# Patient Record
Sex: Female | Born: 1960 | Race: White | Hispanic: No | Smoking: Never smoker
Health system: Southern US, Community
[De-identification: ages and names within clinical notes are randomized; demographics above are authoritative.]

## PROBLEM LIST (undated history)

## (undated) DIAGNOSIS — M1990 Unspecified osteoarthritis, unspecified site: Secondary | ICD-10-CM

## (undated) DIAGNOSIS — I341 Nonrheumatic mitral (valve) prolapse: Secondary | ICD-10-CM

## (undated) DIAGNOSIS — IMO0001 Reserved for inherently not codable concepts without codable children: Secondary | ICD-10-CM

## (undated) DIAGNOSIS — C4491 Basal cell carcinoma of skin, unspecified: Secondary | ICD-10-CM

## (undated) DIAGNOSIS — I10 Essential (primary) hypertension: Secondary | ICD-10-CM

## (undated) DIAGNOSIS — S12100A Unspecified displaced fracture of second cervical vertebra, initial encounter for closed fracture: Secondary | ICD-10-CM

## (undated) DIAGNOSIS — M8580 Other specified disorders of bone density and structure, unspecified site: Secondary | ICD-10-CM

## (undated) HISTORY — DX: Unspecified displaced fracture of second cervical vertebra, initial encounter for closed fracture: S12.100A

## (undated) HISTORY — DX: Reserved for inherently not codable concepts without codable children: IMO0001

## (undated) HISTORY — DX: Essential (primary) hypertension: I10

## (undated) HISTORY — PX: COLONOSCOPY: SHX174

## (undated) HISTORY — PX: POLYPECTOMY: SHX149

## (undated) HISTORY — PX: OTHER SURGICAL HISTORY: SHX169

## (undated) HISTORY — DX: Unspecified osteoarthritis, unspecified site: M19.90

## (undated) HISTORY — DX: Other specified disorders of bone density and structure, unspecified site: M85.80

## (undated) HISTORY — DX: Basal cell carcinoma of skin, unspecified: C44.91

## (undated) HISTORY — DX: Nonrheumatic mitral (valve) prolapse: I34.1

---

## 1984-01-20 DIAGNOSIS — S12100A Unspecified displaced fracture of second cervical vertebra, initial encounter for closed fracture: Secondary | ICD-10-CM

## 1984-01-20 HISTORY — DX: Unspecified displaced fracture of second cervical vertebra, initial encounter for closed fracture: S12.100A

## 1998-12-05 ENCOUNTER — Other Ambulatory Visit: Admission: RE | Admit: 1998-12-05 | Discharge: 1998-12-05 | Payer: Self-pay | Admitting: Obstetrics and Gynecology

## 1999-12-22 ENCOUNTER — Other Ambulatory Visit: Admission: RE | Admit: 1999-12-22 | Discharge: 1999-12-22 | Payer: Self-pay | Admitting: Obstetrics and Gynecology

## 2001-01-17 ENCOUNTER — Other Ambulatory Visit: Admission: RE | Admit: 2001-01-17 | Discharge: 2001-01-17 | Payer: Self-pay | Admitting: Obstetrics and Gynecology

## 2003-01-23 ENCOUNTER — Other Ambulatory Visit: Admission: RE | Admit: 2003-01-23 | Discharge: 2003-01-23 | Payer: Self-pay | Admitting: Family Medicine

## 2003-01-29 ENCOUNTER — Encounter: Admission: RE | Admit: 2003-01-29 | Discharge: 2003-04-29 | Payer: Self-pay | Admitting: Neurology

## 2004-01-24 ENCOUNTER — Ambulatory Visit: Payer: Self-pay | Admitting: Family Medicine

## 2004-01-24 ENCOUNTER — Other Ambulatory Visit: Admission: RE | Admit: 2004-01-24 | Discharge: 2004-01-24 | Payer: Self-pay | Admitting: Family Medicine

## 2004-05-12 ENCOUNTER — Ambulatory Visit: Payer: Self-pay | Admitting: Family Medicine

## 2004-05-15 ENCOUNTER — Encounter: Admission: RE | Admit: 2004-05-15 | Discharge: 2004-05-15 | Payer: Self-pay | Admitting: Family Medicine

## 2005-01-30 ENCOUNTER — Ambulatory Visit: Payer: Self-pay | Admitting: Family Medicine

## 2005-01-30 ENCOUNTER — Other Ambulatory Visit: Admission: RE | Admit: 2005-01-30 | Discharge: 2005-01-30 | Payer: Self-pay | Admitting: Family Medicine

## 2005-01-30 ENCOUNTER — Encounter: Payer: Self-pay | Admitting: Family Medicine

## 2005-02-10 ENCOUNTER — Ambulatory Visit: Payer: Self-pay | Admitting: Family Medicine

## 2005-05-22 ENCOUNTER — Encounter: Admission: RE | Admit: 2005-05-22 | Discharge: 2005-05-22 | Payer: Self-pay | Admitting: Family Medicine

## 2006-02-02 ENCOUNTER — Other Ambulatory Visit: Admission: RE | Admit: 2006-02-02 | Discharge: 2006-02-02 | Payer: Self-pay | Admitting: Family Medicine

## 2006-02-02 ENCOUNTER — Ambulatory Visit: Payer: Self-pay | Admitting: Family Medicine

## 2006-02-02 ENCOUNTER — Encounter: Payer: Self-pay | Admitting: Family Medicine

## 2006-05-24 ENCOUNTER — Encounter: Admission: RE | Admit: 2006-05-24 | Discharge: 2006-05-24 | Payer: Self-pay | Admitting: Family Medicine

## 2006-12-14 ENCOUNTER — Telehealth (INDEPENDENT_AMBULATORY_CARE_PROVIDER_SITE_OTHER): Payer: Self-pay | Admitting: *Deleted

## 2007-01-20 HISTORY — PX: DILATION AND CURETTAGE OF UTERUS: SHX78

## 2007-02-14 ENCOUNTER — Ambulatory Visit: Payer: Self-pay | Admitting: Internal Medicine

## 2007-02-14 ENCOUNTER — Other Ambulatory Visit: Admission: RE | Admit: 2007-02-14 | Discharge: 2007-02-14 | Payer: Self-pay | Admitting: Family Medicine

## 2007-02-14 ENCOUNTER — Encounter: Payer: Self-pay | Admitting: Internal Medicine

## 2007-02-14 DIAGNOSIS — Z8679 Personal history of other diseases of the circulatory system: Secondary | ICD-10-CM | POA: Insufficient documentation

## 2007-02-14 DIAGNOSIS — I10 Essential (primary) hypertension: Secondary | ICD-10-CM | POA: Insufficient documentation

## 2007-02-14 DIAGNOSIS — Z85828 Personal history of other malignant neoplasm of skin: Secondary | ICD-10-CM | POA: Insufficient documentation

## 2007-02-14 LAB — CONVERTED CEMR LAB
Bilirubin Urine: NEGATIVE
Blood in Urine, dipstick: NEGATIVE
Glucose, Urine, Semiquant: NEGATIVE
Ketones, urine, test strip: NEGATIVE
Nitrite: NEGATIVE
Protein, U semiquant: NEGATIVE
Specific Gravity, Urine: <1.005
Urobilinogen, UA: NEGATIVE
WBC Urine, dipstick: NEGATIVE
pH: 5

## 2007-02-15 ENCOUNTER — Encounter: Payer: Self-pay | Admitting: Internal Medicine

## 2007-02-15 LAB — CONVERTED CEMR LAB
ALT: 20 units/L (ref 0–35)
AST: 22 units/L (ref 0–37)
BUN: 10 mg/dL (ref 6–23)
Basophils Absolute: 0 10*3/uL (ref 0.0–0.1)
Basophils Relative: 0.4 % (ref 0.0–1.0)
CO2: 26 meq/L (ref 19–32)
Calcium: 9 mg/dL (ref 8.4–10.5)
Chloride: 104 meq/L (ref 96–112)
Cholesterol: 173 mg/dL (ref 0–200)
Creatinine, Ser: 0.7 mg/dL (ref 0.4–1.2)
Eosinophils Absolute: 0.1 10*3/uL (ref 0.0–0.6)
Eosinophils Relative: 1.3 % (ref 0.0–5.0)
GFR calc Af Amer: 116 mL/min
GFR calc non Af Amer: 96 mL/min
Glucose, Bld: 96 mg/dL (ref 70–99)
HCT: 41 % (ref 36.0–46.0)
HDL: 69.8 mg/dL (ref >39.0)
Hemoglobin: 14 g/dL (ref 12.0–15.0)
LDL Cholesterol: 91 mg/dL (ref 0–99)
Lymphocytes Relative: 33 % (ref 12.0–46.0)
MCHC: 34.1 g/dL (ref 30.0–36.0)
MCV: 92.3 fL (ref 78.0–100.0)
Monocytes Absolute: 0.3 10*3/uL (ref 0.2–0.7)
Monocytes Relative: 5.6 % (ref 3.0–11.0)
Neutro Abs: 3.2 10*3/uL (ref 1.4–7.7)
Neutrophils Relative %: 59.7 % (ref 43.0–77.0)
Pap Smear: NORMAL
Platelets: 245 10*3/uL (ref 150–400)
Potassium: 3.9 meq/L (ref 3.5–5.1)
RBC: 4.44 M/uL (ref 3.87–5.11)
RDW: 11.5 % (ref 11.5–14.6)
Sodium: 136 meq/L (ref 135–145)
TSH: 0.93 microintl units/mL (ref 0.35–5.50)
Total CHOL/HDL Ratio: 2.5
Triglycerides: 59 mg/dL (ref 0–149)
VLDL: 12 mg/dL (ref 0–40)
WBC: 5.4 10*3/uL (ref 4.5–10.5)

## 2007-02-18 ENCOUNTER — Encounter: Payer: Self-pay | Admitting: Internal Medicine

## 2007-02-21 ENCOUNTER — Encounter (INDEPENDENT_AMBULATORY_CARE_PROVIDER_SITE_OTHER): Payer: Self-pay | Admitting: *Deleted

## 2007-04-08 ENCOUNTER — Encounter: Payer: Self-pay | Admitting: Internal Medicine

## 2007-04-08 ENCOUNTER — Encounter (HOSPITAL_COMMUNITY): Payer: Self-pay | Admitting: Obstetrics and Gynecology

## 2007-04-08 ENCOUNTER — Ambulatory Visit (HOSPITAL_COMMUNITY): Admission: RE | Admit: 2007-04-08 | Discharge: 2007-04-08 | Payer: Self-pay | Admitting: Obstetrics and Gynecology

## 2007-05-26 ENCOUNTER — Encounter: Admission: RE | Admit: 2007-05-26 | Discharge: 2007-05-26 | Payer: Self-pay | Admitting: Internal Medicine

## 2007-05-30 ENCOUNTER — Encounter (INDEPENDENT_AMBULATORY_CARE_PROVIDER_SITE_OTHER): Payer: Self-pay | Admitting: *Deleted

## 2008-04-02 ENCOUNTER — Ambulatory Visit: Payer: Self-pay | Admitting: Cardiology

## 2008-04-02 ENCOUNTER — Encounter: Payer: Self-pay | Admitting: Emergency Medicine

## 2008-04-02 ENCOUNTER — Ambulatory Visit: Payer: Self-pay | Admitting: Diagnostic Radiology

## 2008-04-03 ENCOUNTER — Encounter: Payer: Self-pay | Admitting: Internal Medicine

## 2008-04-03 ENCOUNTER — Ambulatory Visit: Payer: Self-pay | Admitting: Cardiovascular Disease

## 2008-04-03 ENCOUNTER — Observation Stay (HOSPITAL_COMMUNITY): Admission: EM | Admit: 2008-04-03 | Discharge: 2008-04-04 | Payer: Self-pay | Admitting: Internal Medicine

## 2008-04-03 ENCOUNTER — Ambulatory Visit: Payer: Self-pay | Admitting: Endocrinology

## 2008-04-04 ENCOUNTER — Telehealth (INDEPENDENT_AMBULATORY_CARE_PROVIDER_SITE_OTHER): Payer: Self-pay

## 2008-04-05 ENCOUNTER — Ambulatory Visit: Payer: Self-pay

## 2008-04-05 ENCOUNTER — Encounter: Payer: Self-pay | Admitting: Internal Medicine

## 2008-04-05 DIAGNOSIS — IMO0001 Reserved for inherently not codable concepts without codable children: Secondary | ICD-10-CM

## 2008-04-05 HISTORY — DX: Reserved for inherently not codable concepts without codable children: IMO0001

## 2008-04-11 ENCOUNTER — Ambulatory Visit: Payer: Self-pay | Admitting: Internal Medicine

## 2008-04-11 DIAGNOSIS — R1013 Epigastric pain: Secondary | ICD-10-CM

## 2008-04-11 DIAGNOSIS — K7689 Other specified diseases of liver: Secondary | ICD-10-CM | POA: Insufficient documentation

## 2008-04-11 DIAGNOSIS — R079 Chest pain, unspecified: Secondary | ICD-10-CM | POA: Insufficient documentation

## 2008-04-11 DIAGNOSIS — K3189 Other diseases of stomach and duodenum: Secondary | ICD-10-CM | POA: Insufficient documentation

## 2008-04-18 ENCOUNTER — Telehealth (INDEPENDENT_AMBULATORY_CARE_PROVIDER_SITE_OTHER): Payer: Self-pay | Admitting: *Deleted

## 2008-04-20 ENCOUNTER — Ambulatory Visit (HOSPITAL_BASED_OUTPATIENT_CLINIC_OR_DEPARTMENT_OTHER): Admission: RE | Admit: 2008-04-20 | Discharge: 2008-04-20 | Payer: Self-pay | Admitting: Internal Medicine

## 2008-04-20 ENCOUNTER — Ambulatory Visit: Payer: Self-pay | Admitting: Diagnostic Radiology

## 2008-04-26 ENCOUNTER — Telehealth: Payer: Self-pay | Admitting: Internal Medicine

## 2008-05-29 ENCOUNTER — Encounter: Admission: RE | Admit: 2008-05-29 | Discharge: 2008-05-29 | Payer: Self-pay | Admitting: Obstetrics and Gynecology

## 2009-03-27 ENCOUNTER — Telehealth (INDEPENDENT_AMBULATORY_CARE_PROVIDER_SITE_OTHER): Payer: Self-pay | Admitting: *Deleted

## 2009-05-28 LAB — CONVERTED CEMR LAB: Pap Smear: NORMAL

## 2009-05-30 ENCOUNTER — Encounter: Admission: RE | Admit: 2009-05-30 | Discharge: 2009-05-30 | Payer: Self-pay | Admitting: Obstetrics and Gynecology

## 2009-05-30 LAB — HM MAMMOGRAPHY: HM Mammogram: NORMAL

## 2009-06-18 ENCOUNTER — Ambulatory Visit: Payer: Self-pay | Admitting: Internal Medicine

## 2009-06-24 LAB — CONVERTED CEMR LAB
ALT: 24 units/L (ref 0–35)
AST: 32 units/L (ref 0–37)
Albumin: 4.2 g/dL (ref 3.5–5.2)
Alkaline Phosphatase: 45 units/L (ref 39–117)
BUN: 13 mg/dL (ref 6–23)
Basophils Absolute: 0 10*3/uL (ref 0.0–0.1)
Basophils Relative: 0.8 % (ref 0.0–3.0)
Bilirubin, Direct: 0.1 mg/dL (ref 0.0–0.3)
CO2: 29 meq/L (ref 19–32)
Calcium: 9.3 mg/dL (ref 8.4–10.5)
Chloride: 100 meq/L (ref 96–112)
Cholesterol: 215 mg/dL — ABNORMAL HIGH (ref 0–200)
Creatinine, Ser: 0.7 mg/dL (ref 0.4–1.2)
Direct LDL: 124.7 mg/dL
Eosinophils Absolute: 0.1 10*3/uL (ref 0.0–0.7)
Eosinophils Relative: 1.2 % (ref 0.0–5.0)
GFR calc non Af Amer: 99.83 mL/min (ref >60)
Glucose, Bld: 75 mg/dL (ref 70–99)
HCT: 40.1 % (ref 36.0–46.0)
HDL: 71.9 mg/dL (ref >39.00)
Hemoglobin: 14.1 g/dL (ref 12.0–15.0)
Lymphocytes Relative: 38.9 % (ref 12.0–46.0)
Lymphs Abs: 1.9 10*3/uL (ref 0.7–4.0)
MCHC: 35.2 g/dL (ref 30.0–36.0)
MCV: 93.8 fL (ref 78.0–100.0)
Monocytes Absolute: 0.3 10*3/uL (ref 0.1–1.0)
Monocytes Relative: 5.7 % (ref 3.0–12.0)
Neutro Abs: 2.6 10*3/uL (ref 1.4–7.7)
Neutrophils Relative %: 53.4 % (ref 43.0–77.0)
Platelets: 216 10*3/uL (ref 150.0–400.0)
Potassium: 4.4 meq/L (ref 3.5–5.1)
RBC: 4.28 M/uL (ref 3.87–5.11)
RDW: 12.6 % (ref 11.5–14.6)
Sodium: 137 meq/L (ref 135–145)
TSH: 0.76 microintl units/mL (ref 0.35–5.50)
Total Bilirubin: 0.7 mg/dL (ref 0.3–1.2)
Total CHOL/HDL Ratio: 3
Total Protein: 6.9 g/dL (ref 6.0–8.3)
Triglycerides: 74 mg/dL (ref 0.0–149.0)
VLDL: 14.8 mg/dL (ref 0.0–40.0)
WBC: 4.9 10*3/uL (ref 4.5–10.5)

## 2009-07-23 ENCOUNTER — Telehealth (INDEPENDENT_AMBULATORY_CARE_PROVIDER_SITE_OTHER): Payer: Self-pay | Admitting: *Deleted

## 2010-02-16 LAB — CONVERTED CEMR LAB
ALT: 32 units/L (ref 0–40)
AST: 25 units/L (ref 0–37)
Albumin: 4.1 g/dL (ref 3.5–5.2)
Alkaline Phosphatase: 47 units/L (ref 39–117)
BUN: 11 mg/dL (ref 6–23)
Basophils Absolute: 0 10*3/uL (ref 0.0–0.1)
Basophils Relative: 0.6 % (ref 0.0–1.0)
CO2: 28 meq/L (ref 19–32)
Calcium: 9.7 mg/dL (ref 8.4–10.5)
Chloride: 102 meq/L (ref 96–112)
Cholesterol: 193 mg/dL (ref 0–200)
Creatinine, Ser: 0.7 mg/dL (ref 0.4–1.2)
Eosinophils Relative: 1.7 % (ref 0.0–5.0)
GFR calc Af Amer: 117 mL/min
GFR calc non Af Amer: 97 mL/min
Glucose, Bld: 89 mg/dL (ref 70–99)
HCT: 40.7 % (ref 36.0–46.0)
HDL: 70.1 mg/dL (ref >39.0)
Hemoglobin: 13.7 g/dL (ref 12.0–15.0)
LDL Cholesterol: 116 mg/dL — ABNORMAL HIGH (ref 0–99)
Lymphocytes Relative: 37.7 % (ref 12.0–46.0)
MCHC: 33.6 g/dL (ref 30.0–36.0)
MCV: 92.2 fL (ref 78.0–100.0)
Monocytes Absolute: 0.3 10*3/uL (ref 0.2–0.7)
Monocytes Relative: 6.7 % (ref 3.0–11.0)
Neutro Abs: 2.8 10*3/uL (ref 1.4–7.7)
Neutrophils Relative %: 53.3 % (ref 43.0–77.0)
Platelets: 230 10*3/uL (ref 150–400)
Potassium: 4.2 meq/L (ref 3.5–5.1)
RBC: 4.41 M/uL (ref 3.87–5.11)
RDW: 11.5 % (ref 11.5–14.6)
Sodium: 137 meq/L (ref 135–145)
TSH: 0.98 microintl units/mL (ref 0.35–5.50)
Total Bilirubin: 1.3 mg/dL — ABNORMAL HIGH (ref 0.3–1.2)
Total CHOL/HDL Ratio: 2.8
Total Protein: 6.8 g/dL (ref 6.0–8.3)
Triglycerides: 33 mg/dL (ref 0–149)
VLDL: 7 mg/dL (ref 0–40)
WBC: 5.1 10*3/uL (ref 4.5–10.5)

## 2010-02-18 NOTE — Letter (Signed)
Summary: Results Follow up Letter  [facility] [place]  [address] [place], [place] [address]   Phone: [phone]  Fax: [phone]    02/21/2007 MRN: [id number]  [facility] [address] [place], [place]  [address]  Dear Ms. Ketron,  The following are the results of your recent test(s):  Test         Result    Pap Smear:        Normal __x___  Not Normal _____ Comments: ___________Your pap smear was normal!  Next pap due in 1 year!___________________________________________ Cholesterol: LDL(Bad cholesterol):         Your goal is less than:         HDL (Good cholesterol):       Your goal is more than: Comments:  ______________________________________________________ Mammogram:        Normal _____  Not Normal _____ Comments:  ___________________________________________________________________ Hemoccult:        Normal _____  Not normal _______ Comments:    _____________________________________________________________________ Other Tests:    We routinely do not discuss normal results over the telephone.  If you desire a copy of the results, or you have any questions about this information we can discuss them at your next office visit.   Sincerely,

## 2010-02-18 NOTE — Letter (Signed)
Summary: Cancer Screening/[facility] Personalized Risk Profile  Cancer Screening/[facility] Personalized Risk Profile   Imported By: Lanelle Bal 04/13/2008 13:17:10  _____________________________________________________________________  External Attachment:    Type:   Image     Comment:   External Document

## 2010-02-18 NOTE — Assessment & Plan Note (Signed)
Summary: CPX W/ PAP AND FASTING LABS//CYD  Medications Added * BCP       Allergies Added: ! PCN ! SULFA ! * ALDACTONE  Vital Signs:  Patient Profile:   50 Years Old Female LMP:     02/06/2007 Height:     64.5 inches Weight:      134.2 pounds Pulse rate:   76 / minute BP sitting:   124 / 80  Vitals Entered By: Shary Decamp (February 14, 2007 8:12 AM)  Menstrual History: LMP (date): 02/06/2007             Is Patient Diabetic? No     Chief Complaint:  cpx - pap.  History of Present Illness: CPX c/o spotting in betwen periods x 4 months no increase cramping or irregular periods   Current Allergies (reviewed today): ! PCN ! SULFA ! 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    Seizure disorder - Brother    DM- no    colon ca--no  Social History:    Single   Risk Factors:  Tobacco use:  never Passive smoke exposure:  no Drug use:  no Alcohol use:  yes  Mammogram History:     Date of Last Mammogram:  05/24/2006    Results:  normal    Review of Systems       other than below, ROS  is negative   CV      Denies chest pain or discomfort and swelling of feet.  Resp      Denies cough and shortness of breath.  GI      Denies diarrhea, nausea, and vomiting.      hemorrhoids w/ occ bleed w/ whipping  GU      Denies discharge, dysuria, urinary frequency, and urinary hesitancy.   Physical Exam  General:     alert, well-developed, and well-nourished.   Neck:     no thyromegaly.   Breasts:     No mass, nodules, thickening, tenderness, bulging, retraction, inflamation, nipple discharge or skin changes noted.   Lungs:     normal respiratory effort, no intercostal retractions, no accessory muscle use, and normal breath sounds.   Heart:  normal rate, regular rhythm, and no murmur.   Abdomen:     soft, non-tender, no hepatomegaly, and no splenomegaly.   Genitalia:     normal introitus, no external lesions, and mucosa pink and moist.  Cervix slt  red (?neovascularization). Bimanual Normal Extremities:     no edema Psych:     not anxious appearing and not depressed appearing.      Impression & Recommendations:  Problem # 1:  HEALTH SCREENING (ICD-V70.0) has MMG yearly , last 5-08  Orders: TLB-Lipid Panel (80061-LIPID) TLB-BMP (Basic Metabolic Panel-BMET) (80048-METABOL) TLB-CBC Platelet - w/Differential (85025-CBCD) TLB-TSH (Thyroid Stimulating Hormone) (84443-TSH) TLB-ALT (SGPT) (84460-ALT) TLB-AST (SGOT) (84450-SGOT) Gynecologic Referral (Gyn) Venipuncture (09381)   Problem # 2:  to Gyn due to: spotting abnormal cercical inspection Cont. w/ BCP?  Complete Medication List: 1)  Ramipril 2.5 Mg Caps (Ramipril) .... Take one capsule daily 2)  Bcp  Patient Instructions: 1)  Please schedule a follow-up appointment in 12 months.    Prescriptions: RAMIPRIL 2.5 MG  CAPS (RAMIPRIL) take one capsule daily  #90 x 3   Entered and Authorized by:   Nolon Rod. Paz MD   Signed by:   Nolon Rod. Paz MD on 02/14/2007   Method used:   Print then Give to Patient   RxID:   [id number]  ]  Preventive Care Screening  Mammogram:    Date:  05/24/2006    Results:  normal     Varicella Immunization History:    Varicella # 1:  Historical (10/06/2000)    Varicella # 2:  Historical (11/03/2000)  Tetanus/Td Immunization History:    Tetanus/Td # 1:  historical (10/06/2000)  Other Immunization History:    TwinRix # 1:  TwinRix (10/06/2000)    TwinRix # 2:  TwinRix (11/03/2000)   Laboratory Results   Urine Tests    Routine Urinalysis   Color: yellow Appearance: Clear Glucose: negative   (Normal Range: Negative) Bilirubin: negative   (Normal Range: Negative) Ketone: negative   (Normal Range:  Negative) Spec. Gravity: <1.005   (Normal Range: 1.003-1.035) Blood: negative   (Normal Range: Negative) pH: 5.0   (Normal Range: 5.0-8.0) Protein: negative   (Normal Range: Negative) Urobilinogen: negative   (Normal Range: 0-1) Nitrite: negative   (Normal Range: Negative) Leukocyte Esterace: negative   (Normal Range: Negative)    Comments: ..................................................................Marland KitchenFloydene Flock CMA  February 14, 2007 1:16 PM

## 2010-02-18 NOTE — Progress Notes (Signed)
Summary: LOWNE-REFILL RAMIPRIL 2.5MG   Medications Added RAMIPRIL 2.5 MG  CAPS (RAMIPRIL) take one capsule daily       Phone Note Refill Request   Refills Requested: Medication #1:  RAMIPRIL CAPS 2.5MG  TAKE 1 CAPSULE DAILY AS DIRECTED RECD BY FAX [facility] [phone] ***FAX TO FOLLOW***  Initial call taken by: Gwen Pounds,  December 14, 2006 8:37 AM  Follow-up for Phone Call        prescription faxed to [facility] Follow-up by: Doristine Devoid,  December 14, 2006 1:01 PM    New/Updated Medications: RAMIPRIL 2.5 MG  CAPS (RAMIPRIL) take one capsule daily   Prescriptions: RAMIPRIL 2.5 MG  CAPS (RAMIPRIL) take one capsule daily  #90 x 0   Entered by:   Doristine Devoid   Authorized by:   Loreen Freud DO   Signed by:   Doristine Devoid on 12/14/2006   Method used:   Historical   RxID:   [id number]

## 2010-02-18 NOTE — Progress Notes (Signed)
Summary: u/s results  Phone Note Call from Patient Call back at Work Phone [phone]   Summary of Call: Patient left message on VM requesting results of u/s [name] [name]  April 26, 2008 9:43 AM  discussed with radiology, as long as they are simple cysts, there is no need for follow-up. Advised patient, she does have hepatic cysts,   benign.  No further workup suggested at this time. Signed by Nolon Rod Paz MD on 04/26/2008 at 10:09 AM lmom for pt to return call [name] [name]  April 26, 2008 1:05 PM  discussed with pt [name] [name]  April 26, 2008 1:40 PM

## 2010-02-18 NOTE — Assessment & Plan Note (Signed)
Summary: Nuclear Study  Nuclear Med Background History: Echo Symptoms: Chest Pain, SOB  Nuclear Pre-Procedure Cardiac Risk Factors: Family History - CAD, Hypertension Caffeine/Decaff Intake: none NPO After: 8:00 PM Lungs: clear IV 0.9% NS with Angio Cath: 22g     IV Site: (R) forearm IV Started by: Irean Hong RN Chest Size (in) 34     Cup Size B     Height (in): 63.5 Weight (lb): 130 BMI: 22.75  Nuclear Med Study 1 or 2 day study:  1 day     Stress Test Type:  Stress Reading MD:  Dietrich Pates, MD     Referring MD:  Dayle Points Resting Radionuclide:  Technetium 71m Tetrofosmin     Resting Radionuclide Dose:  10 mCi  Stress Radionuclide:  Technetium 58m Tetrofosmin     Stress Radionuclide Dose:  30 mCi   Stress Protocol Exercise Time (min):  8:35 min     Max HR:  160 bpm     Predicted Max HR: 174 bpm  Max Systolic BP: 142 mm Hg     % Max HR:  91 %     METS: 10.10 Rate Pressure Product:  27035    Stress Test Technologist:  Milana Na EMT-P     Nuclear Technologist:  Harlow Asa CNMT  Rest Procedure  Myocardial perfusion imaging was performed at rest 45 minutes following the intraveneous administration of Myoview Technetium 31m Tetrofosmin.  Stress Procedure  The patient exercised for 8:35.  The patient stopped due to fatigue and denied any chest pain.  There were no significant ST-T wave changes.  Myoview was injected at peak exercise and myocardial perfusion imaging was performed after a brief delay.  QPS Raw Data Images:  Stress images were motion corrected. Stress Images:  There is normal uptake in all areas. Rest Images:  Normal homogeneous uptake in all areas of the myocardium. Subtraction (SDS):  No evidence of ischemia. Transient Ischemic Dilatation:  1.03  (Normal <1.22)  Lung/Heart Ratio:  .22  (Normal <0.45)  Quantitative Gated Spect Images QGS EDV:  64 ml QGS ESV:  20 ml QGS EF:  69 % QGS cine images:  Normal wall motion   Overall  Impression Exercise Capacity: Good exercise capacity. BP Response: Normal blood pressure response. Clinical Symptoms: No chest pain ECG Impression: No significant ST segment change suggestive of ischemia. Overall Impression: Normal stress nuclear study.

## 2010-02-18 NOTE — Progress Notes (Signed)
Summary: Pantoprazole not a covered med//PAZ  Phone Note From Pharmacy   Caller: [facility]*  fax [phone] Summary of Call: form faxed  med pantoprazole; To authorize a COVERED ALTERNATIVE , CHOOSE FROM THE FOLLOWING MEDICATIONS 1--OMEPRAZOLE, 2--NEXIUM   Without your response we cannot dispense the prescription..Please advise Initial call taken by: Kandice Hams,  April 18, 2008 8:37 AM  Follow-up for Phone Call        if the patient is agreeable to change,switch to Nexium 40 mg one tablet a day Follow-up by: Elita Quick E. Paz MD,  April 18, 2008 9:02 AM  Additional Follow-up for Phone Call Additional follow up Details #1::        left msg with pt rx, due to ins coveage for pantoprozale not covered preferred alternative is Nexium and rx has been sent to [facility], any questions or concerns please call  Additional Follow-up by: Kandice Hams,  April 18, 2008 11:17 AM    New/Updated Medications: NEXIUM 40 MG CPDR (ESOMEPRAZOLE MAGNESIUM) 1 by mouth once daily   Prescriptions: NEXIUM 40 MG CPDR (ESOMEPRAZOLE MAGNESIUM) 1 by mouth once daily  #90 x 0   Entered by:   Kandice Hams   Authorized by:   Nolon Rod. Paz MD   Signed by:   Kandice Hams on 04/18/2008   Method used:   Historical   RxID:   [id number]

## 2010-02-18 NOTE — Assessment & Plan Note (Signed)
Summary: CPX,FASTING,[employer] INS/RH......   Vital Signs:  Patient profile:   50 year old female Height:      63.5 inches Weight:      140.6 pounds BMI:     24.60 Pulse rate:   72 / minute BP sitting:   144 / 80  Vitals Entered By: Shary Decamp (Jun 18, 2009 1:06 PM) CC: cpx, fasting - no pap   History of Present Illness: CPX off PPIs, doing well , no symptoms  Occasional headaches, usually at the time of her periods, no associated nausea, vomiting, diplopia. has not taken any medications as the headaches are not severe  Current Medications (verified): 1)  Ramipril 2.5 Mg  Caps (Ramipril) .... Take One Capsule Daily 2)  Lo Ovral 3)  Mvi 4)  Fish Oil 5)  Lysine 6)  Bayer Low Strength 81 Mg Tbec (Aspirin) .Marland Kitchen.. 1 By Mouth Once Daily  Allergies (verified): 1)  ! Pcn 2)  ! Sulfa 3)  ! * Aldactone  Past History:  Family History: Last updated: 02/14/2007 CAD - no Cervical Ca - 2 sisters Breast Ca - MGM COPD - F HTN - M ? Seizure disorder - Brother DM- no colon ca--no  Social History: Last updated: 06/18/2009 Single tobacco--no ETOH-- socially  diet-- moderate exercise-- no   Risk Factors: Alcohol Use: <1 (04/11/2008) Exercise: no (04/11/2008)  Risk Factors: Smoking Status: never (04/11/2008) Passive Smoke Exposure: no (04/11/2008)  Past Medical History: Reviewed history from 04/11/2008 and no changes required. Hypertension MVP ? 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 CAD - no Cervical Ca - 2 sisters Breast Ca - MGM COPD - F HTN - M ? Seizure disorder - Brother DM- no colon ca--no  Social History: Single tobacco--no ETOH-- socially  diet-- moderate exercise-- no   Review of Systems General:  Denies fatigue, fever, and weight  loss. CV:  Denies chest pain or discomfort, palpitations, and swelling of feet. Resp:  Denies cough and shortness of breath. GI:  Denies diarrhea, nausea, and vomiting. GU:  Denies dysuria, urinary frequency, and urinary hesitancy; sees gyn . Psych:  Denies anxiety and depression.  Physical Exam  General:  alert, well-developed, and well-nourished.   Neck:  no masses, no thyromegaly, and normal carotid upstroke.   Lungs:  normal respiratory effort, no intercostal retractions, no accessory muscle use, and normal breath sounds.   Heart:  normal rate, regular rhythm, and no murmur.   Abdomen:  soft, non-tender, no distention, no masses, no guarding, and no rigidity.   Extremities:  no pretibial edema bilaterally  Psych:  Cognition and judgment appear intact. Alert and cooperative with normal attention span and concentration ,not anxious appearing and not depressed appearing.     Impression & Recommendations:  Problem # 1:  HEALTH SCREENING (ICD-V70.0) Td 2002 female check per gyn on birth control pills, prescribed by gynecology, they're planning to discuss stopping meds when she reaches 50 years old Encourage diet  and exercise, printed material provided Labs some headaches, mostly with her periods. Will call me if the headaches become more intense or if she has associated symptoms  Orders: Venipuncture (54098) TLB-BMP (Basic Metabolic Panel-BMET) (80048-METABOL) TLB-CBC Platelet - w/Differential (85025-CBCD) TLB-Hepatic/Liver Function Pnl (80076-HEPATIC) TLB-Lipid Panel (80061-LIPID) TLB-TSH (Thyroid Stimulating Hormone) (84443-TSH)  Problem # 2:  HYPERTENSION (ICD-401.9) no change Her updated medication list for this problem includes:    Ramipril 2.5 Mg Caps (Ramipril) .Marland Kitchen... Take one capsule daily  BP today: 144/80 Prior BP: 124/80 (04/11/2008)  Prior 10 Yr Risk Heart Disease: 2 % (04/05/2008)  Labs Reviewed: K+: 3.9 (02/14/2007) Creat: : 0.7 (02/14/2007)   Chol: 173  (02/14/2007)   HDL: 69.8 (02/14/2007)   LDL: 91 (02/14/2007)   TG: 59 (02/14/2007)  Complete Medication List: 1)  Ramipril 2.5 Mg Caps (Ramipril) .... Take one capsule daily 2)  Lo Ovral  3)  Mvi  4)  Fish Oil  5)  Lysine  6)  Bayer Low Strength 81 Mg Tbec (Aspirin) .Marland Kitchen.. 1 by mouth once daily  Patient Instructions: 1)  Check your blood pressure 2 or 3 times a week. If it is more than 140/85 consistently,please let us know  2)  Please schedule a follow-up appointment in 1 year.  Prescriptions: RAMIPRIL 2.5 MG  CAPS (RAMIPRIL) take one capsule daily  #90 x 3   Entered and Authorized by:   Nolon Rod. Jailan Trimm MD   Signed by:   Nolon Rod. Neilan Rizzo MD on 06/18/2009   Method used:   Print then Give to Patient   RxID:   [id number]    Preventive Care Screening  Mammogram:    Date:  05/30/2009    Results:  normal   Pap Smear:    Date:  05/28/2009    Results:  normal

## 2010-02-18 NOTE — Progress Notes (Signed)
Summary: rx  Phone Note Refill Request Message from:  Pharmacy on [facility]  Refills Requested: Medication #1:  RAMIPRIL 2.5 MG  CAPS take one capsule daily Initial call taken by: Kandice Hams,  March 27, 2009 8:38 AM    Prescriptions: RAMIPRIL 2.5 MG  CAPS (RAMIPRIL) take one capsule daily  #90 x 0   Entered by:   Kandice Hams   Authorized by:   Nolon Rod. Paz MD   Signed by:   Kandice Hams on 03/27/2009   Method used:   Faxed to ...       [facility]* (mail-order)             ,          Ph: [phone]       Fax: [phone]   RxID:   [id number]

## 2010-02-18 NOTE — Progress Notes (Signed)
Summary: Rx for Cipro for travels  Phone Note Call from Patient Call back at Home Phone [phone]   Caller: Patient Reason for Call: Talk to Nurse, Talk to Doctor Summary of Call: Patient called and left a message on the triage line asking for a rx for Cipro because she will be traveling to [place]. Please advise.  Initial call taken by: Harold Barban,  July 23, 2009 1:15 PM  Follow-up for Phone Call        okay to prescribe ciprofloxacin 500 mg one p.o. b.i.d. for 3 days, #6, no refills. This is to be used in case of mild area while in [place]. If she gets moderately  ill, she needs to be seen by a local physician Follow-up by: Nolon Rod. Paz MD,  July 23, 2009 2:30 PM  Additional Follow-up for Phone Call Additional follow up Details #1::        [facility]acy is [facility]. Additional Follow-up by: Harold Barban,  July 24, 2009 9:32 AM    New/Updated Medications: CIPRO 500 MG TABS (CIPROFLOXACIN HCL) one p.o. b.i.d. for 3 days Prescriptions: CIPRO 500 MG TABS (CIPROFLOXACIN HCL) one p.o. b.i.d. for 3 days  #6 x 0   Entered by:   Army Fossa CMA   Authorized by:   Nolon Rod. Paz MD   Signed by:   Army Fossa CMA on 07/24/2009   Method used:   Electronically to        [facility] [phone]* (retail)       [address]       [place], [place]  [address]       Ph: [phone]       Fax: [phone]   RxID:   [id number]

## 2010-02-18 NOTE — Progress Notes (Signed)
Summary: Nuclear Pre-Procedure  Phone Note Outgoing Call Call back at [facility] Phone [phone]   Call placed by: Irean Hong, RN,  April 04, 2008 10:16 AM Summary of Call: Left message with information on Myoview Information Sheet (see scanned document for details).     Nuclear Med Background Indications for Stress Test: Evaluation for Ischemia, Post Hospital Indications Comments: 04-03-08:CP/SOB History: Echo History Comments: 04-03-08: ECHO EF 60-65% Symptoms: Chest Pain, SOB  Nuclear Pre-Procedure Cardiac Risk Factors: Family History - CAD, Hypertension Height (in): 64.5

## 2010-02-18 NOTE — Letter (Signed)
Summary: Results Follow up Letter  [facility] [place]  [address] [place], [place] [address]   Phone: [phone]  Fax: [phone]    05/30/2007 MRN: [id number]  [facility] [address] [place], [place]  [address]  Dear [name] [name],  The following are the results of your recent test(s):  Test         Result    Pap Smear:        Normal _____  Not Normal _____ Comments: ______________________________________________________ Cholesterol: LDL(Bad cholesterol):         Your goal is less than:         HDL (Good cholesterol):       Your goal is more than: Comments:  ______________________________________________________ Mammogram:        Normal __X___  Not Normal _____ Comments:  _______NEXT MAMMOGRAM DUE IN 1 YEAR ____________________________________________________________ Hemoccult:        Normal _____  Not normal _______ Comments:    _____________________________________________________________________ Other Tests:    We routinely do not discuss normal results over the telephone.  If you desire a copy of the results, or you have any questions about this information we can discuss them at your next office visit.   Sincerely,

## 2010-02-18 NOTE — Miscellaneous (Signed)
Summary: MMG   Clinical Lists Changes  Observations: Added new observation of MAMMOGRAM: normal (05/26/2007 10:30)         Preventive Care Screening  Mammogram:    Date:  05/26/2007    Results:  normal

## 2010-02-18 NOTE — Miscellaneous (Signed)
Summary: pap   Clinical Lists Changes  Observations: Added new observation of PAP SMEAR: normal (02/15/2007 16:58)       Preventive Care Screening  Pap Smear:    Date:  02/15/2007    Results:  normal
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
## 2010-04-21 ENCOUNTER — Other Ambulatory Visit (HOSPITAL_COMMUNITY): Payer: Self-pay | Admitting: Obstetrics and Gynecology

## 2010-04-21 DIAGNOSIS — Z1231 Encounter for screening mammogram for malignant neoplasm of breast: Secondary | ICD-10-CM

## 2010-05-01 LAB — DIFFERENTIAL
Basophils Absolute: 0 10*3/uL (ref 0.0–0.1)
Basophils Relative: 1 % (ref 0–1)
Eosinophils Absolute: 0.1 10*3/uL (ref 0.0–0.7)
Eosinophils Relative: 1 % (ref 0–5)
Lymphocytes Relative: 33 % (ref 12–46)
Lymphs Abs: 2.5 10*3/uL (ref 0.7–4.0)
Monocytes Absolute: 0.4 10*3/uL (ref 0.1–1.0)
Monocytes Relative: 5 % (ref 3–12)
Neutro Abs: 4.6 10*3/uL (ref 1.7–7.7)
Neutrophils Relative %: 60 % (ref 43–77)

## 2010-05-01 LAB — CK TOTAL AND CKMB (NOT AT ARMC)
CK, MB: 0.8 ng/mL (ref 0.3–4.0)
CK, MB: 0.8 ng/mL (ref 0.3–4.0)
CK, MB: 0.9 ng/mL (ref 0.3–4.0)
CK, MB: 0.7 ng/mL (ref 0.3–4.0)
Relative Index: INVALID (ref 0.0–2.5)
Relative Index: INVALID (ref 0.0–2.5)
Relative Index: INVALID (ref 0.0–2.5)
Relative Index: INVALID (ref 0.0–2.5)
Total CK: 67 U/L (ref 7–177)
Total CK: 76 U/L (ref 7–177)
Total CK: 76 U/L (ref 7–177)
Total CK: 79 U/L (ref 7–177)

## 2010-05-01 LAB — COMPREHENSIVE METABOLIC PANEL
ALT: 22 U/L (ref 0–35)
AST: 22 U/L (ref 0–37)
Albumin: 3.6 g/dL (ref 3.5–5.2)
Alkaline Phosphatase: 46 U/L (ref 39–117)
BUN: 6 mg/dL (ref 6–23)
CO2: 26 mEq/L (ref 19–32)
Calcium: 9 mg/dL (ref 8.4–10.5)
Chloride: 103 mEq/L (ref 96–112)
Creatinine, Ser: 0.78 mg/dL (ref 0.4–1.2)
GFR calc Af Amer: >60 mL/min (ref >60)
GFR calc non Af Amer: >60 mL/min (ref >60)
Glucose, Bld: 135 mg/dL — ABNORMAL HIGH (ref 70–99)
Potassium: 4.2 mEq/L (ref 3.5–5.1)
Sodium: 136 mEq/L (ref 135–145)
Total Bilirubin: 1 mg/dL (ref 0.3–1.2)
Total Protein: 5.9 g/dL — ABNORMAL LOW (ref 6.0–8.3)

## 2010-05-01 LAB — HEMOGLOBIN A1C
Hgb A1c MFr Bld: 5.2 % (ref 4.6–6.1)
Mean Plasma Glucose: 103 mg/dL

## 2010-05-01 LAB — BASIC METABOLIC PANEL
BUN: 11 mg/dL (ref 6–23)
CO2: 28 mEq/L (ref 19–32)
Calcium: 9.2 mg/dL (ref 8.4–10.5)
Chloride: 102 mEq/L (ref 96–112)
Creatinine, Ser: 0.7 mg/dL (ref 0.4–1.2)
GFR calc Af Amer: >60 mL/min (ref >60)
GFR calc non Af Amer: >60 mL/min (ref >60)
Glucose, Bld: 83 mg/dL (ref 70–99)
Potassium: 3.8 mEq/L (ref 3.5–5.1)
Sodium: 139 mEq/L (ref 135–145)

## 2010-05-01 LAB — POCT CARDIAC MARKERS
CKMB, poc: <1 ng/mL — ABNORMAL LOW (ref 1.0–8.0)
CKMB, poc: <1 ng/mL — ABNORMAL LOW (ref 1.0–8.0)
Myoglobin, poc: 39.9 ng/mL (ref 12–200)
Myoglobin, poc: 44.3 ng/mL (ref 12–200)
Troponin i, poc: <0.05 ng/mL (ref 0.00–0.09)
Troponin i, poc: <0.05 ng/mL (ref 0.00–0.09)

## 2010-05-01 LAB — CBC
HCT: 38.3 % (ref 36.0–46.0)
HCT: 41.8 % (ref 36.0–46.0)
Hemoglobin: 13.6 g/dL (ref 12.0–15.0)
Hemoglobin: 14.4 g/dL (ref 12.0–15.0)
MCHC: 35.5 g/dL (ref 30.0–36.0)
MCHC: 34.3 g/dL (ref 30.0–36.0)
MCV: 92 fL (ref 78.0–100.0)
MCV: 93 fL (ref 78.0–100.0)
Platelets: 207 10*3/uL (ref 150–400)
Platelets: 224 10*3/uL (ref 150–400)
RBC: 4.17 MIL/uL (ref 3.87–5.11)
RBC: 4.5 MIL/uL (ref 3.87–5.11)
RDW: 12.8 % (ref 11.5–15.5)
RDW: 12 % (ref 11.5–15.5)
WBC: 7 10*3/uL (ref 4.0–10.5)
WBC: 7.6 10*3/uL (ref 4.0–10.5)

## 2010-05-01 LAB — LIPID PANEL
Cholesterol: 168 mg/dL (ref 0–200)
HDL: 62 mg/dL (ref >39)
LDL Cholesterol: 93 mg/dL (ref 0–99)
Total CHOL/HDL Ratio: 2.7 RATIO
Triglycerides: 63 mg/dL (ref <150)
VLDL: 13 mg/dL (ref 0–40)

## 2010-05-01 LAB — PREGNANCY, URINE: Preg Test, Ur: NEGATIVE

## 2010-05-01 LAB — D-DIMER, QUANTITATIVE: D-Dimer, Quant: 0.32 ug/mL-FEU (ref 0.00–0.48)

## 2010-05-01 LAB — TROPONIN I
Troponin I: <0.01 ng/mL (ref 0.00–0.06)
Troponin I: <0.01 ng/mL (ref 0.00–0.06)
Troponin I: <0.01 ng/mL (ref 0.00–0.06)
Troponin I: <0.01 ng/mL (ref 0.00–0.06)

## 2010-05-01 LAB — TSH: TSH: 2.171 u[IU]/mL (ref 0.350–4.500)

## 2010-05-01 LAB — BRAIN NATRIURETIC PEPTIDE: Pro B Natriuretic peptide (BNP): <30 pg/mL (ref 0.0–100.0)

## 2010-05-05 ENCOUNTER — Other Ambulatory Visit: Payer: Self-pay | Admitting: Internal Medicine

## 2010-06-03 NOTE — Discharge Summary (Signed)
NAME:  [name] [name], [name] [name]                ACCOUNT NO.:  [id number]   MEDICAL RECORD NO.:  [id number]          PATIENT TYPE:  INP   LOCATION:  3705                         FACILITY:  [facility]   PHYSICIAN:  Sean A. Everardo All, MD    DATE OF BIRTH:  02-15-1960   DATE OF ADMISSION:  04/03/2008  DATE OF DISCHARGE:  04/04/2008                               DISCHARGE SUMMARY   REASON FOR ADMISSION:  Chest pain.   HISTORY OF THE PRESENT ILLNESS:  A 50 year old woman admitted by Dr.  Adela Glimpse on April 03, 2008, with chest pain.  Please refer to her history  and dictation for details.   HOSPITAL COURSE:  The patient was admitted, and cardiac enzymes and  echocardiogram were normal.  She was seen in consultation by Cardiology  who agreed her situation was low risk and she was appropriate for  outpatient nuclear medicine study, and this was scheduled.  She is also  advised to take a trial of Prilosec on the thought that her symptoms  might be GI in etiology.   She also was noted incidentally on her CT scan (which ruled out  pulmonary emboli), to have a question of hepatic cyst and workup of this  is deferred to the outpatient setting.   By April 04, 2008, she was doing much better, alert, oriented, eating a  usual diet and was thus discharged home in good condition.   DIAGNOSES AT THE TIME OF DISCHARGE:  1. Chest symptoms, possibly GI in etiology.  2. Hypertension.   MEDICATIONS:  1. Prilosec 20 mg a day.  2. Otherwise same as on the admission history and physical.   She is advised to minimize her activity until she completes the nuclear  medicine study scheduled for tomorrow.   No restriction on diet.   DISCHARGE INSTRUCTIONS:  1. Follow up with Dr. Drue Novel as scheduled next week, focused followup.      Check results of nuclear medicine study.  2. Check symptomatic response to Prilosec.  3. Consider ultrasound to evaluate hepatic cysts incidentally noted on      CT.     Sean A. Everardo All, MD  Electronically Signed    SAE/MEDQ  D:  04/04/2008  T:  04/04/2008  Job:  [id number]
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
## 2010-06-03 NOTE — H&P (Signed)
NAME:  [name], [name]                ACCOUNT NO.:  [id number]   MEDICAL RECORD NO.:  [id number]          PATIENT TYPE:  INP   LOCATION:  3705                         FACILITY:  [facility]   PHYSICIAN:  Michiel Cowboy, MDDATE OF BIRTH:  November 23, 1960   DATE OF ADMISSION:  04/03/2008  DATE OF DISCHARGE:                              HISTORY & PHYSICAL   PRIMARY CARE PHYSICIAN:  Willow Ora, MD   CHIEF COMPLAINT:  Chest pain, shortness of breath.   HISTORY OF PRESENT ILLNESS:  The patient is a 50 year old female with  past medical history significant for hypertension.  For the past week,  says she has been feeling like she is more short of breath than usual.  Can not quite put her finger on it, but feels like she has a cold, but  reports no URI-type symptoms and just more aware of her breath.  She  also has been having chest pain on the left also for about a week, it is  intermittent, and she describes it as a pain in her left breast tissue.  This could be reproducible by palpation.  No nausea, no vomiting, no  diaphoresis.  No constipation or diarrhea.  No fevers.  Otherwise,  review of systems completely negative except for decrease in appetite.   PAST MEDICAL HISTORY:  Hypertension.   FAMILY HISTORY:  Significant for mother who died in car accident in age  of 74s.  Brother who died from possibly cardiac causes, although, listed  as heart failure with history significant obesity and had a tobacco  abuse.  Father died of also tobacco abuse, but heart attack and renal  failure was listed on his death certificate.  Per patient, she is not  sure about any cardiac history, in particular him.   SOCIAL HISTORY:  The patient drinks occasional wines but does not abuse  drugs.  Denies tobacco.   ALLERGIES:  PENICILLIN AND SULFA.   MEDICATIONS:  1. Women's daily vitamins.  2. Lysine 1000 once a day.  3. Fish oil 1000 once daily.  4. Altace 2.5 mg daily.  5. Baby aspirin 81 mg daily.  6.  Lo/Ovral birth control pills.   PHYSICAL EXAMINATION:  VITAL SIGNS:  Temperature 98.0, blood pressure  initially 158/104, heart rate 95, respirations 14, sating 100% on room  air.  GENERAL:  Currently in no acute distress.  HEENT:  Head nontraumatic.  Mucous membranes moist.  HEART:  Regular rate and rhythm.  Slightly rapid.  BREASTS:  Left breast there is some fibrocystic slight changes with some  tenderness, mild.  Per patient, similar to her pain.  LUNGS:  Clear to auscultation bilaterally.  ABDOMEN:  Soft, nontender, nondistended.  EXTREMITIES:  Lower extremities without cyanosis, clubbing or edema.  NEUROLOGICAL:  Cranial nerves II-XII intact.  Strength 5/5 in all four  extremities.  SKIN:  Clean, dry and intact.   LABORATORY DATA:  White blood cell count 7.6, hemoglobin 14.4.  Sodium  139, potassium 3.8, creatinine 0.7, cardiac enzymes negative.  Pregnancy  test negative.  D. dimer negative.  EKG showing slightly less than 1  mm  ST depressions in lead V4, V5, 2 and AVF.  There is no old EKG  available.  Chest x-ray negative.  CT scan of her chest showing no PE.  No evidence of pneumonia, but there are multiple cysts on the liver and  they counted up to 11 cysts.  The largest measuring up to 2 cm or so.   ASSESSMENT/PLAN:  1. This is a 50 year old female with very atypical chest pain, not      completely normal EKG, but more consistent probably nonspecific      changes, negative cardiac markers and a questionable family      history.  Will admit, cycle cardiac markers.  Given slightly      abnormal EKG, will check 2D echo in the a.m. to evaluate for any      wall-motion abnormalities as well as help [place] to determine if there      are any events of heart failure to explain any of her shortness of      breath, which I doubt. Check hemoglobin A1C, fasting lipid panel,      TSH.  Consider cardiology consult.  Will follow up as an outpatient      for stress test.  Given family  history, will also do serial EKGs.  2. Hypertension.  Considering her hypertension, we will increase      Altace.  Per patient, she feels very anxious right now and her      hypertension could be related to some anxiety.  Will follow.  3. Multiple liver cysts per CT scan, unfortunately, full examination      of the liver was not able to be obtained. Will obtain ultrasound of      abdomen to evaluate liver and kidneys and evaluate for polycystic      disease, since the patient does have somebody in her family who      died from kidney failure.  4. Prophylaxis.  Protonix with Lovenox.   Dr. Rene Paci will assume care in the a.m.      Michiel Cowboy, MD  Electronically Signed     AVD/MEDQ  D:  04/03/2008  T:  04/03/2008  Job:  [id number]   cc:   Willow Ora, MD

## 2010-06-03 NOTE — H&P (Signed)
NAME:  [name] [name] NO.:  [id number]   MEDICAL RECORD NO.:  [id number]           PATIENT TYPE:   LOCATION:                                 FACILITY:   PHYSICIAN:  Zelphia Cairo, MD         DATE OF BIRTH:   DATE OF ADMISSION:  04/08/2007  DATE OF DISCHARGE:                              HISTORY & PHYSICAL   A 50 year old G0 P0 female who presented to the office in February with  complaints of irregular menstrual bleeding.  She had been having brown  discharge between her periods for over 2 months, despite being on birth  control pills for several years.  Her menstrual cycles are usually  approximately every 28 days, with light spotting 2 weeks prior to her  period.  She denies any postcoital bleeding.  Sonohystogram was  performed in the office, showing a 13 mm and 9 mm polypoid mass within  the endometrial cavity.  Bilateral ovaries appeared normal.  Given her  symptoms of irregular bleeding, surgical management of the endometrial  polyps was discussed.   PAST MEDICAL HISTORY:  Hypertension.   SURGICAL HISTORY:  1. Cervical spine surgery.  2. Audiostomy.   SOCIAL HISTORY:  Negative for tobacco use   ALLERGIES:  1. PENICILLIN.  2. SULFA.   FAMILY HISTORY:  Negative for GYN and colon cancer.  She had a maternal  grandmother with breast cancer.   PHYSICAL EXAMINATION:  VITAL SIGNS:  Height 5 feet 3 inches, weight 134,  blood pressure 140/90.  HEART:  Regular rate and rhythm.  LUNGS:  Clear to auscultation bilaterally.  ABDOMEN:  Soft, nontender, nondistended.  PELVIC:  Shows normal external female genitalia, and urethral meatus,  vagina, and cervix are normal.  No lesions.  Uterus is mobile and  nontender.  EXTREMITIES;  Without clubbing, cyanosis, or edema.   ASSESSMENT AND PLAN:  A 50 year old G0 with an endometrial polyp.  Plan  for hysteroscopy D&C.  Risks, benefits, and alternatives were discussed  with the patient, and informed consent was  obtained.      Zelphia Cairo, MD  Electronically Signed    GA/MEDQ  D:  04/07/2007  T:  04/07/2007  Job:  [id number]

## 2010-06-03 NOTE — Op Note (Signed)
NAME:  Gorsline, Havilah                ACCOUNT NO.:  [id number]   MEDICAL RECORD NO.:  [id number]          PATIENT TYPE:  AMB   LOCATION:  SDC                           FACILITY:  [facility]   PHYSICIAN:  Zelphia Cairo, MD    DATE OF BIRTH:  [birth year]   DATE OF PROCEDURE:  04/08/2007  DATE OF DISCHARGE:                               OPERATIVE REPORT   PREOPERATIVE DIAGNOSIS:  1. Abnormal uterine bleeding.  2. Suspect endometrial polyp.   POSTOPERATIVE DIAGNOSIS:  same, path pending   PROCEDURE:  1. Cervical block.  2. Hysteroscopy D&C with polypectomy.   SURGEON:  Dr. Zelphia Cairo.   ANESTHESIA:  General and local.   FINDINGS:  Endometrial polyp anterior right uterine wall.   SPECIMEN:  Endometrial curettings to pathology.   FLUID DEFICIT:  30 mL.   ESTIMATED BLOOD LOSS:  Minimal.   COMPLICATIONS:  None.   CONDITION:  Stable to recovery room.   PROCEDURE:  The patient was taken to the operating room where anesthesia  was obtained. She was placed in the dorsal lithotomy position using  Allen stirrups.  She was prepped and draped in a sterile fashion and a  catheter was used to drain her bladder for approximately 100 mL of clear  urine.  A bivalve speculum was then placed in the vagina and a single-  tooth tenaculum placed on the anterior lip of the cervix. 8 mL of 1%  lidocaine was used to provide a cervical block.  The cervix was serially  dilated with Shawnie Pons dilators.  The diagnostic hysteroscope was inserted  into the uterine cavity and a survey of the uterus was performed.  She  was noted to have a polypoid mass on the anterior right uterine wall and  a small polypoid mass on the left wall of the internal cervical os.  The  remainder of the endometrial cavity appeared normal.  The hysteroscope  was then removed. Polyp forceps were inserted and used to grasp the  polypoid mass. A gentle curetting was then performed. The hysteroscope  was reinserted. Polypoid  masses  were found to be removed. Hysteroscope, single-tooth tenaculum  and speculum were then removed. The cervix was found to be hemostatic.  Jolisa was taken to the recovery room in stable condition.  Sponge lap,  needle and instrument counts were correct x2.      Zelphia Cairo, MD  Electronically Signed     GA/MEDQ  D:  04/08/2007  T:  04/08/2007  Job:  [id number]

## 2010-06-09 ENCOUNTER — Ambulatory Visit
Admission: RE | Admit: 2010-06-09 | Discharge: 2010-06-09 | Disposition: A | Discharge status: Home / Self Care | Payer: 59 | Source: Ambulatory Visit | Attending: Obstetrics and Gynecology | Admitting: Obstetrics and Gynecology

## 2010-06-09 DIAGNOSIS — Z1231 Encounter for screening mammogram for malignant neoplasm of breast: Secondary | ICD-10-CM

## 2010-07-15 ENCOUNTER — Other Ambulatory Visit: Payer: Self-pay | Admitting: Dermatology

## 2010-08-03 ENCOUNTER — Other Ambulatory Visit: Payer: Self-pay | Admitting: Internal Medicine

## 2010-09-01 ENCOUNTER — Ambulatory Visit (INDEPENDENT_AMBULATORY_CARE_PROVIDER_SITE_OTHER): Payer: 59 | Admitting: Internal Medicine

## 2010-09-01 ENCOUNTER — Encounter: Payer: Self-pay | Admitting: Internal Medicine

## 2010-09-01 DIAGNOSIS — Z0000 Encounter for general adult medical examination without abnormal findings: Secondary | ICD-10-CM | POA: Insufficient documentation

## 2010-09-01 DIAGNOSIS — J329 Chronic sinusitis, unspecified: Secondary | ICD-10-CM | POA: Insufficient documentation

## 2010-09-01 DIAGNOSIS — I10 Essential (primary) hypertension: Secondary | ICD-10-CM

## 2010-09-01 LAB — CBC WITH DIFFERENTIAL/PLATELET
Basophils Absolute: 0 10*3/uL (ref 0.0–0.1)
Basophils Relative: 0.5 % (ref 0.0–3.0)
Eosinophils Absolute: 0.2 10*3/uL (ref 0.0–0.7)
Eosinophils Relative: 1.9 % (ref 0.0–5.0)
HCT: 39.7 % (ref 36.0–46.0)
Hemoglobin: 13.4 g/dL (ref 12.0–15.0)
Lymphocytes Relative: 19.9 % (ref 12.0–46.0)
Lymphs Abs: 1.7 10*3/uL (ref 0.7–4.0)
MCHC: 33.8 g/dL (ref 30.0–36.0)
MCV: 92.4 fl (ref 78.0–100.0)
Monocytes Absolute: 0.5 10*3/uL (ref 0.1–1.0)
Monocytes Relative: 5.6 % (ref 3.0–12.0)
Neutro Abs: 6 10*3/uL (ref 1.4–7.7)
Neutrophils Relative %: 72.1 % (ref 43.0–77.0)
Platelets: 229 10*3/uL (ref 150.0–400.0)
RBC: 4.29 Mil/uL (ref 3.87–5.11)
RDW: 12.1 % (ref 11.5–14.6)
WBC: 8.3 10*3/uL (ref 4.5–10.5)

## 2010-09-01 LAB — LIPID PANEL
Cholesterol: 195 mg/dL (ref 0–200)
HDL: 77.4 mg/dL (ref >39.00)
LDL Cholesterol: 108 mg/dL — ABNORMAL HIGH (ref 0–99)
Total CHOL/HDL Ratio: 3
Triglycerides: 46 mg/dL (ref 0.0–149.0)
VLDL: 9.2 mg/dL (ref 0.0–40.0)

## 2010-09-01 LAB — TSH: TSH: 1.07 u[IU]/mL (ref 0.35–5.50)

## 2010-09-01 MED ORDER — AZITHROMYCIN 250 MG PO TABS: ORAL_TABLET | ORAL | Status: AC

## 2010-09-01 MED ORDER — RAMIPRIL 2.5 MG PO CAPS: 2.5000 mg | ORAL_CAPSULE | Freq: Every day | ORAL | Status: DC

## 2010-09-01 MED ORDER — AZITHROMYCIN 250 MG PO TABS: ORAL_TABLET | ORAL | Status: DC

## 2010-09-01 NOTE — Assessment & Plan Note (Signed)
See instructions

## 2010-09-01 NOTE — Assessment & Plan Note (Addendum)
Td 2002 female check per gyn, up dated on [facility] , PAPs on birth control pills, low dose , prescribed by gynecology, they're planning to discuss stopping meds when she reaches 50 years old Encourage diet  and exercise  Labs  Planning a Cscope at age 68

## 2010-09-01 NOTE — Progress Notes (Signed)
  Subjective:    Patient ID: [name] [name], female    DOB: 04-May-1960, 50 y.o.   MRN: [id number]  HPI  Complete physical exam Developed sinus congestion and some pressure 6 days ago, colored  discharge noted. Mild cough and mild chest congestion. No fever.  Past Medical History: Hypertension MVP ? 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 Seizure disorder - Brother DM- no colon ca--no  Social History: Single Works at [employer] tobacco--no ETOH-- socially  diet-- ok most of the time  exercise-- no   Review of Systems  Cardiovascular: Negative for chest pain.  Gastrointestinal: Negative for abdominal pain and blood in stool.  Genitourinary: Negative for dysuria, vaginal bleeding and difficulty urinating.  Psychiatric/Behavioral:       No Anxiety-depression       Objective:   Physical Exam  Constitutional: She is oriented to person, place, and time. She appears well-developed and well-nourished. No distress.  HENT:  Head: Normocephalic and atraumatic.  Right Ear: External ear normal.       Left tympanic membrane with some fluid, no red, no discharge. Face nontender to palpation  Neck: No thyromegaly present.  Cardiovascular: Normal rate and regular rhythm.   No murmur heard. Pulmonary/Chest: Effort normal and breath sounds normal. No respiratory distress. She has no wheezes. She has no rales.  Abdominal: Soft. Bowel sounds are normal. She exhibits no distension. There is no tenderness. There is no rebound and no guarding.  Musculoskeletal: She exhibits no edema.  Neurological: She is alert and oriented to person, place, and time.  Skin: Skin is warm and dry. She is not diaphoretic.  Psychiatric: She has a normal mood and affect. Her behavior  is normal. Judgment and thought content normal.          Assessment & Plan:

## 2010-09-01 NOTE — Patient Instructions (Signed)
Rest, fluids , tylenol For cough, take Mucinex DM twice a day as needed  Take the antibiotic as prescribed Call if no better in few days Call anytime if the symptoms are severe, you have high fever, short of breath

## 2010-09-01 NOTE — Assessment & Plan Note (Signed)
Well controlled 

## 2010-09-02 LAB — COMPLETE METABOLIC PANEL WITH GFR
ALT: 14 U/L (ref 0–35)
AST: 19 U/L (ref 0–37)
Albumin: 4.2 g/dL (ref 3.5–5.2)
Alkaline Phosphatase: 63 U/L (ref 39–117)
BUN: 9 mg/dL (ref 6–23)
CO2: 27 mEq/L (ref 19–32)
Calcium: 9.3 mg/dL (ref 8.4–10.5)
Chloride: 101 mEq/L (ref 96–112)
Creat: 0.64 mg/dL (ref 0.50–1.10)
GFR, Est African American: >60 mL/min (ref >60)
GFR, Est Non African American: >60 mL/min (ref >60)
Glucose, Bld: 77 mg/dL (ref 70–99)
Potassium: 4.5 mEq/L (ref 3.5–5.3)
Sodium: 138 mEq/L (ref 135–145)
Total Bilirubin: 0.5 mg/dL (ref 0.3–1.2)
Total Protein: 7 g/dL (ref 6.0–8.3)

## 2010-09-15 ENCOUNTER — Encounter: Payer: Self-pay | Admitting: Internal Medicine

## 2010-09-15 ENCOUNTER — Ambulatory Visit (INDEPENDENT_AMBULATORY_CARE_PROVIDER_SITE_OTHER): Payer: 59 | Admitting: Internal Medicine

## 2010-09-15 VITALS — BP 136/98 | HR 88 | Temp 98.4°F | Wt 142.0 lb

## 2010-09-15 DIAGNOSIS — J029 Acute pharyngitis, unspecified: Secondary | ICD-10-CM

## 2010-09-15 DIAGNOSIS — J310 Chronic rhinitis: Secondary | ICD-10-CM

## 2010-09-15 LAB — POCT RAPID STREP A (OFFICE): Rapid Strep A Screen: NEGATIVE

## 2010-09-15 MED ORDER — FLUTICASONE PROPIONATE 50 MCG/ACT NA SUSP: 1.0000 | NASAL | Status: DC

## 2010-09-15 NOTE — Patient Instructions (Signed)
Plain Mucinex for thick secretions ;force NON dairy fluids for next 48 hrs. Use a Neti pot daily as needed for sinus congestion Zicam Melts or Zinc lozenges ; vitamin C 2000 mg daily; & Echinacea for 4-7 days. Report fever, exudate("pus") or progressive pain. Blood Pressure Goal  Ideally is an AVERAGE < 135/85. This AVERAGE should be calculated from @ least 5-7 BP readings taken @ different times of day on different days of week. You should not respond to isolated BP readings , but rather the AVERAGE for that week

## 2010-09-15 NOTE — Progress Notes (Signed)
  Subjective:    Patient ID: [name] [name], female    DOB: 1960-05-16, 50 y.o.   MRN: [id number]  HPI Sore Onset/symptoms:combined bronchitic & URI symptoms 2.5 weeks ago after return from [place] Exposures (illness/environmental/extrinsic):s[place] in [place] Pre return flight Progression of symptoms:purulent secretions resolved but PNDr persists; ST as of 8/24 Treatments/response:Zpack with 80% improvement Fever/chills/sweats:no Frontal headache:no Facial pain:no Dental pain:no Lymphadenopathy:no Wheezing/shortness of breath:no Cough/sputum:dramatically better Associated extrinsic/allergic symptoms:itchy eyes/ sneezing:no Past medical history: Seasonal allergies;minor/asthma:no Smoking history:never           Review of Systems "heaviness " L ear     Objective:   Physical Exam General appearance is of good health and nourishment; no acute distress or increased work of breathing is present.  No  lymphadenopathy about the head, neck, or axilla noted.   Eyes: No conjunctival inflammation or lid edema is present.  Ears:  External ear exam shows no significant lesions or deformities.  Otoscopic examination reveals clear canals, tympanic membranes are intact bilaterally without bulging, retraction, inflammation or discharge. TMs slightly dull  Nose:  External nasal examination shows no deformity or inflammation. Nasal mucosa are  dry without lesions or exudates. Slight L septal dislocation with minimal  obstruction to airflow.   Oral exam: Dental hygiene is good; lips and gums are healthy appearing.There is no oropharyngeal erythema or exudate noted.     Heart:  Normal rate and regular rhythm. S1 and S2 normal without gallop, murmur, rub or other extra sounds.  Click not appreciated  Lungs:Chest clear to auscultation; no wheezes, rhonchi,rales ,or rubs present.No increased work of breathing.    Extremities:  No cyanosis, edema, or clubbing  noted    Skin: Warm & dry          Assessment & Plan:  #1 pharyngitis with negative beta strep, status post Z-Pak. She states only here because she'll be flying again and is concerned about complications of such  #2 hypertension, she denies the intake of decongestants  Plan: See recommendations and orders.

## 2010-10-13 LAB — CBC
HCT: 42.6
Hemoglobin: 14.9
MCHC: 35
MCV: 91.8
Platelets: 267
RBC: 4.64
RDW: 12.6
WBC: 6.6

## 2010-10-13 LAB — PREGNANCY, URINE: Preg Test, Ur: NEGATIVE

## 2010-10-13 LAB — TYPE AND SCREEN
ABO/RH(D): O POS
Antibody Screen: NEGATIVE

## 2010-10-13 LAB — ABO/RH: ABO/RH(D): O POS

## 2011-01-19 ENCOUNTER — Other Ambulatory Visit: Payer: Self-pay | Admitting: Dermatology

## 2011-06-02 ENCOUNTER — Other Ambulatory Visit: Payer: Self-pay | Admitting: Obstetrics and Gynecology

## 2011-06-02 DIAGNOSIS — Z1231 Encounter for screening mammogram for malignant neoplasm of breast: Secondary | ICD-10-CM

## 2011-06-11 ENCOUNTER — Ambulatory Visit
Admission: RE | Admit: 2011-06-11 | Discharge: 2011-06-11 | Disposition: A | Discharge status: Home / Self Care | Payer: 59 | Source: Ambulatory Visit | Attending: Obstetrics and Gynecology | Admitting: Obstetrics and Gynecology

## 2011-06-11 DIAGNOSIS — Z1231 Encounter for screening mammogram for malignant neoplasm of breast: Secondary | ICD-10-CM

## 2011-09-11 ENCOUNTER — Ambulatory Visit (INDEPENDENT_AMBULATORY_CARE_PROVIDER_SITE_OTHER): Payer: 59 | Admitting: Internal Medicine

## 2011-09-11 VITALS — BP 122/82 | HR 73 | Temp 98.4°F | Ht 64.0 in | Wt 140.0 lb

## 2011-09-11 DIAGNOSIS — K7689 Other specified diseases of liver: Secondary | ICD-10-CM

## 2011-09-11 DIAGNOSIS — I10 Essential (primary) hypertension: Secondary | ICD-10-CM

## 2011-09-11 DIAGNOSIS — Z0000 Encounter for general adult medical examination without abnormal findings: Secondary | ICD-10-CM

## 2011-09-11 DIAGNOSIS — M1990 Unspecified osteoarthritis, unspecified site: Secondary | ICD-10-CM

## 2011-09-11 LAB — COMPREHENSIVE METABOLIC PANEL
ALT: 27 U/L (ref 0–35)
AST: 26 U/L (ref 0–37)
Albumin: 4.2 g/dL (ref 3.5–5.2)
Alkaline Phosphatase: 64 U/L (ref 39–117)
BUN: 18 mg/dL (ref 6–23)
CO2: 28 mEq/L (ref 19–32)
Calcium: 9.9 mg/dL (ref 8.4–10.5)
Chloride: 103 mEq/L (ref 96–112)
Creatinine, Ser: 0.6 mg/dL (ref 0.4–1.2)
GFR: 114.55 mL/min (ref >60.00)
Glucose, Bld: 84 mg/dL (ref 70–99)
Potassium: 4.3 mEq/L (ref 3.5–5.1)
Sodium: 138 mEq/L (ref 135–145)
Total Bilirubin: 0.6 mg/dL (ref 0.3–1.2)
Total Protein: 7.1 g/dL (ref 6.0–8.3)

## 2011-09-11 LAB — LIPID PANEL
Cholesterol: 214 mg/dL — ABNORMAL HIGH (ref 0–200)
HDL: 96.1 mg/dL (ref >39.00)
Total CHOL/HDL Ratio: 2
Triglycerides: 42 mg/dL (ref 0.0–149.0)
VLDL: 8.4 mg/dL (ref 0.0–40.0)

## 2011-09-11 LAB — LDL CHOLESTEROL, DIRECT: Direct LDL: 108.5 mg/dL

## 2011-09-11 MED ORDER — RAMIPRIL 2.5 MG PO CAPS: 2.5000 mg | ORAL_CAPSULE | Freq: Every day | ORAL | Status: DC

## 2011-09-11 NOTE — Assessment & Plan Note (Addendum)
Td 2002 and 10-12-2007 female check per gyn, 05-2011 MMG (-) , recent PAP (-)  Encourage diet  and exercise  Labs  Has a scheduled colonoscopy by September 2013

## 2011-09-11 NOTE — Assessment & Plan Note (Signed)
Good compliance with BP meds, normal ambulatory BPs. No change

## 2011-09-11 NOTE — Assessment & Plan Note (Signed)
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 Recommend judicious use of Motrin, eyes, possibly a over-the-counter thumb splinter. If not better, she is encouraged to call for a referral to sports medicine, local injection?

## 2011-09-11 NOTE — Progress Notes (Signed)
  Subjective:    Patient ID: [name] [name], female    DOB: March 10, 1960, 51 y.o.   MRN: [id number]  HPI CPX  Past Medical History: Hypertension MVP ? 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 Seizure disorder - Brother DM- no colon ca--no  Social History: Single, lives w/ boyfriend Works at [employer] tobacco--no ETOH-- socially   diet-- ok most of the time   exercise-- walking 3 times a week for 40 minutes, plans to do some weight lifting as well  Review of Systems Feeling very good, no chest pain, shortness of breath. No nausea, vomiting, diarrhea Complains of on and off pain at the base of the thumb without radiation to the forearm. No swelling, no injury, does type a lot at work.    Objective:   Physical Exam General -- alert, well-developed, and not overweight appearing. No apparent distress.  Neck --no thyromegaly  Lungs -- normal respiratory effort, no intercostal retractions, no accessory muscle use, and normal breath sounds.   Heart-- normal rate, regular rhythm, no murmur, and no gallop.   Abdomen--soft, non-tender, no distention, no masses, no HSM, no guarding, and no rigidity.   Extremities-- no pretibial edema bilaterally ; inspection & palpation of wrists and hands normal except for pain to palpation at the base of the right thumb. Neurologic-- alert & oriented X3 and strength normal in all extremities. Psych-- Cognition and judgment appear intact. Alert and cooperative with normal attention span and concentration.  not anxious appearing and not depressed appearing.       Assessment & Plan:

## 2011-09-11 NOTE — Patient Instructions (Signed)
Ibuprofen (Motrin) 200 mg OTC 2 or 3 tabs every 8 hours as need for pain . This medicine can cause gastritis-ulcers in the stomach, if you develop nausea, stomach pain, change in the color of stools : stop ibuprofen and call us  ------

## 2011-09-11 NOTE — Assessment & Plan Note (Signed)
Liver ultrasound 04/2008 1. Numerous hepatic cysts as identified on the prior CT chest. No  solid hepatic lesions. Index right and left lobe cysts are  measured above.  2. Normal abdominal ultrasound otherwise.

## 2011-09-12 ENCOUNTER — Encounter: Payer: Self-pay | Admitting: Internal Medicine

## 2011-09-12 LAB — VITAMIN D 25 HYDROXY (VIT D DEFICIENCY, FRACTURES): Vit D, 25-Hydroxy: 54 ng/mL (ref 30–89)

## 2011-09-14 ENCOUNTER — Encounter: Payer: Self-pay | Admitting: *Deleted

## 2011-09-25 ENCOUNTER — Ambulatory Visit (AMBULATORY_SURGERY_CENTER): Payer: 59

## 2011-09-25 VITALS — Ht 64.0 in | Wt 141.5 lb

## 2011-09-25 DIAGNOSIS — Z1211 Encounter for screening for malignant neoplasm of colon: Secondary | ICD-10-CM

## 2011-09-25 MED ORDER — MOVIPREP 100 G PO SOLR: 1.0000 | Freq: Once | ORAL | Status: DC

## 2011-09-28 ENCOUNTER — Encounter: Payer: Self-pay | Admitting: Gastroenterology

## 2011-10-07 ENCOUNTER — Encounter: Payer: 59 | Admitting: Gastroenterology

## 2011-10-09 ENCOUNTER — Ambulatory Visit (AMBULATORY_SURGERY_CENTER): Payer: 59 | Admitting: Gastroenterology

## 2011-10-09 ENCOUNTER — Encounter: Payer: Self-pay | Admitting: Gastroenterology

## 2011-10-09 VITALS — BP 148/90 | HR 86 | Temp 98.6°F | Resp 17

## 2011-10-09 DIAGNOSIS — Z1211 Encounter for screening for malignant neoplasm of colon: Secondary | ICD-10-CM

## 2011-10-09 DIAGNOSIS — K644 Residual hemorrhoidal skin tags: Secondary | ICD-10-CM

## 2011-10-09 MED ORDER — SODIUM CHLORIDE 0.9 % IV SOLN: 500.0000 mL | INTRAVENOUS | Status: DC

## 2011-10-09 NOTE — Progress Notes (Signed)
Patient did not experience any of the following events: a burn prior to discharge; a fall within the facility; wrong site/side/patient/procedure/implant event; or a hospital transfer or hospital admission upon discharge from the facility. (G8907) Patient did not have preoperative order for IV antibiotic SSI prophylaxis. (G8918)  

## 2011-10-09 NOTE — Op Note (Signed)
[facility] [address] [place], [address]   COLONOSCOPY PROCEDURE REPORT  PATIENT: [name], [name]  MR#: [id number] BIRTHDATE: February 27, 1960 , 51  yrs. old GENDER: Female ENDOSCOPIST: Mardella Layman, MD, Clementeen Graham REFERRED BY:  Willow Ora, M.D. PROCEDURE DATE:  10/09/2011 PROCEDURE:   Colonoscopy, diagnostic ASA CLASS:   Class II INDICATIONS:average risk patient for colon cancer. MEDICATIONS: Propofol (Diprivan) 240 mg IV  DESCRIPTION OF PROCEDURE:   After the risks and benefits and of the procedure were explained, informed consent was obtained.  A digital rectal exam revealed no abnormalities of the rectum.    The LB CF-[id number]  endoscope was introduced through the anus and advanced to the cecum, which was identified by both the appendix and ileocecal valve .  The quality of the prep was excellent, using MoviPrep .  The instrument was then slowly withdrawn as the colon was fully examined.     COLON FINDINGS: A normal appearing cecum, ileocecal valve, and appendiceal orifice were identified.  The ascending, hepatic flexure, transverse, splenic flexure, descending, sigmoid colon and rectum appeared unremarkable.  No polyps or cancers were seen. Large internal and external hemorrhoids were found.     Retroflexed views revealed internal/external hemorrhoids.     The scope was then withdrawn from the patient and the procedure completed.  COMPLICATIONS: There were no complications. ENDOSCOPIC IMPRESSION: 1.   Normal colon 2.   Large internal and external hemorrhoids  RECOMMENDATIONS: 1.  Continue current colorectal screening recommendations for "routine risk" patients with a repeat colonoscopy in 10 years. 2.  High fiber diet   REPEAT EXAM:  cc:  _______________________________ eSignedMardella Layman, MD, [facility] 10/09/2011 2:14 PM

## 2011-10-09 NOTE — Patient Instructions (Addendum)

## 2011-10-12 ENCOUNTER — Telehealth: Payer: Self-pay | Admitting: *Deleted

## 2011-10-12 NOTE — Telephone Encounter (Signed)
  Follow up Call-  Call back number 10/09/2011  Post procedure Call Back phone  # [phone]  Permission to leave phone message Yes     Patient questions:  Do you have a fever, pain , or abdominal swelling? no Pain Score  0 *  Have you tolerated food without any problems? yes  Have you been able to return to your normal activities? yes  Do you have any questions about your discharge instructions: Diet   no Medications  no Follow up visit  no  Do you have questions or concerns about your Care? no  Actions: * If pain score is 4 or above: No action needed, pain <4.

## 2012-02-08 ENCOUNTER — Other Ambulatory Visit: Payer: Self-pay | Admitting: Dermatology

## 2012-04-22 ENCOUNTER — Encounter: Payer: 59 | Admitting: Internal Medicine

## 2012-04-29 ENCOUNTER — Encounter: Payer: 59 | Admitting: Internal Medicine

## 2012-05-12 ENCOUNTER — Other Ambulatory Visit: Payer: Self-pay

## 2012-05-12 DIAGNOSIS — Z1231 Encounter for screening mammogram for malignant neoplasm of breast: Secondary | ICD-10-CM

## 2012-06-14 ENCOUNTER — Ambulatory Visit
Admission: RE | Admit: 2012-06-14 | Discharge: 2012-06-14 | Disposition: A | Discharge status: Home / Self Care | Payer: 59 | Source: Ambulatory Visit

## 2012-06-14 DIAGNOSIS — Z1231 Encounter for screening mammogram for malignant neoplasm of breast: Secondary | ICD-10-CM

## 2012-08-14 ENCOUNTER — Other Ambulatory Visit: Payer: Self-pay | Admitting: Internal Medicine

## 2012-08-15 NOTE — Telephone Encounter (Signed)
lmovm to return call to office to schedule OV so refill can be processed.

## 2012-08-16 NOTE — Telephone Encounter (Signed)
Pt. Scheduled for appointment 10/03/12. Refill done.

## 2012-09-22 ENCOUNTER — Ambulatory Visit (INDEPENDENT_AMBULATORY_CARE_PROVIDER_SITE_OTHER): Payer: 59 | Admitting: Internal Medicine

## 2012-09-22 ENCOUNTER — Encounter: Payer: Self-pay | Admitting: Internal Medicine

## 2012-09-22 VITALS — BP 127/87 | HR 64 | Temp 99.0°F | Ht 64.0 in | Wt 143.0 lb

## 2012-09-22 DIAGNOSIS — M1990 Unspecified osteoarthritis, unspecified site: Secondary | ICD-10-CM

## 2012-09-22 DIAGNOSIS — Z0000 Encounter for general adult medical examination without abnormal findings: Secondary | ICD-10-CM

## 2012-09-22 DIAGNOSIS — I10 Essential (primary) hypertension: Secondary | ICD-10-CM

## 2012-09-22 LAB — LIPID PANEL
Cholesterol: 226 mg/dL — ABNORMAL HIGH (ref 0–200)
HDL: 88 mg/dL (ref >39.00)
Total CHOL/HDL Ratio: 3
Triglycerides: 45 mg/dL (ref 0.0–149.0)
VLDL: 9 mg/dL (ref 0.0–40.0)

## 2012-09-22 LAB — CBC WITH DIFFERENTIAL/PLATELET
Basophils Absolute: 0 10*3/uL (ref 0.0–0.1)
Basophils Relative: 0.8 % (ref 0.0–3.0)
Eosinophils Absolute: 0.1 10*3/uL (ref 0.0–0.7)
Eosinophils Relative: 1.9 % (ref 0.0–5.0)
HCT: 39.4 % (ref 36.0–46.0)
Hemoglobin: 13.5 g/dL (ref 12.0–15.0)
Lymphocytes Relative: 40.1 % (ref 12.0–46.0)
Lymphs Abs: 2 10*3/uL (ref 0.7–4.0)
MCHC: 34.3 g/dL (ref 30.0–36.0)
MCV: 89.6 fl (ref 78.0–100.0)
Monocytes Absolute: 0.3 10*3/uL (ref 0.1–1.0)
Monocytes Relative: 6.1 % (ref 3.0–12.0)
Neutro Abs: 2.5 10*3/uL (ref 1.4–7.7)
Neutrophils Relative %: 51.1 % (ref 43.0–77.0)
Platelets: 208 10*3/uL (ref 150.0–400.0)
RBC: 4.39 Mil/uL (ref 3.87–5.11)
RDW: 12.7 % (ref 11.5–14.6)
WBC: 4.9 10*3/uL (ref 4.5–10.5)

## 2012-09-22 LAB — LDL CHOLESTEROL, DIRECT: Direct LDL: 128.4 mg/dL

## 2012-09-22 LAB — COMPREHENSIVE METABOLIC PANEL
ALT: 22 U/L (ref 0–35)
AST: 23 U/L (ref 0–37)
Albumin: 4.5 g/dL (ref 3.5–5.2)
Alkaline Phosphatase: 51 U/L (ref 39–117)
BUN: 14 mg/dL (ref 6–23)
CO2: 30 mEq/L (ref 19–32)
Calcium: 9.5 mg/dL (ref 8.4–10.5)
Chloride: 102 mEq/L (ref 96–112)
Creatinine, Ser: 0.7 mg/dL (ref 0.4–1.2)
GFR: 96.84 mL/min (ref >60.00)
Glucose, Bld: 82 mg/dL (ref 70–99)
Potassium: 3.8 mEq/L (ref 3.5–5.1)
Sodium: 137 mEq/L (ref 135–145)
Total Bilirubin: 1.1 mg/dL (ref 0.3–1.2)
Total Protein: 7.3 g/dL (ref 6.0–8.3)

## 2012-09-22 LAB — TSH: TSH: 0.57 u[IU]/mL (ref 0.35–5.50)

## 2012-09-22 NOTE — Assessment & Plan Note (Addendum)
Td 10-12-2007 zostavax discussed  Had a yellow fever shot for work female check per gyn, 04-2012 MMG (-) cscope (-) except for hemorrhoids 09-2011, next per Dr Pennie Banter

## 2012-09-22 NOTE — Progress Notes (Signed)
  Subjective:    Patient ID: Ballard Russell, female    DOB: [birth year], 52 y.o.   MRN: [id number]  HPI CPX Past Medical History  Diagnosis Date  . HTN (hypertension)   . MVP (mitral valve prolapse)   . Skin cancer, basal cell   . C2 cervical fracture 1986    no surgery, had a halo  . Normal cardiac stress test 04/05/2008    Negative   Past Surgical History  Procedure Laterality Date  . Dilation and curettage of uterus  2009  . Polypectomy      Uterus  . Palatal osteotomy     Family History: CAD - no Cervical Ca - 2 sisters Breast Ca - MGM COPD - F HTN - M ? Seizure disorder - Brother DM- no colon ca--no  Social History: Single, lives w/ boyfriend Works at [employer] tobacco--no ETOH-- socially    Review of [employer]-- ok most of the time   exercise-- walking 3 times a week for 30 minutes  Recently diagnosed with plantar fasciitis by a podiatrist, taking meloxicam rarely, stretching and using the brace. No chest pain or shortness or breath No nausea, vomiting, diarrhea or blood in the stools. No anxiety-depression.      Objective:   Physical Exam BP 127/87  Pulse 64  Temp(Src) 99 F (37.2 C)  Ht 5\' 4"  (1.626 m)  Wt 143 lb (64.864 kg)  BMI 24.53 kg/m2  SpO2 98% General -- alert, well-developed, NAD.  Neck --no thyromegaly , normal carotid pulse Lungs -- normal respiratory effort, no intercostal retractions, no accessory muscle use, and normal breath sounds.  Heart-- normal rate, regular rhythm, no murmur.  Abdomen-- Not distended, good bowel sounds,soft, non-tender. No rebound or rigidity.  Extremities-- no pretibial edema bilaterally  Neurologic-- alert & oriented X3. Speech, gait normal. Psych-- Cognition and judgment appear intact. Alert and cooperative with normal attention span and concentration. not anxious appearing and not depressed appearing.      Assessment & Plan:

## 2012-09-22 NOTE — Patient Instructions (Addendum)
Get your blood work before you leave  Next visit in 1 year  for a physical exam. Please make an appointment before you leave the office today (or call few weeks in advance) --- Check the  blood pressure 2 or 3 times a month, be sure it is between 110/60 and 140/85. If it is consistently higher or lower, let me know

## 2012-09-22 NOTE — Assessment & Plan Note (Signed)
Well-controlled, no change 

## 2012-09-22 NOTE — Assessment & Plan Note (Signed)
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

## 2012-09-26 ENCOUNTER — Telehealth: Payer: Self-pay | Admitting: *Deleted

## 2012-09-26 ENCOUNTER — Encounter: Payer: Self-pay | Admitting: *Deleted

## 2012-09-26 NOTE — Telephone Encounter (Signed)
Message copied by Eustace Quail on Mon Sep 26, 2012  8:19 AM ------      Message from: [name]      Created: Sun Sep 25, 2012 11:51 AM       Send a letter       [name] [name],      Your cholesterol is slightly higher than before, it went from 214 to 226. The LDL or bad cholesterol remains satisfactory at 128.      All the other tests: liver, kidney, blood count are thyroid are normal.      These are good results!      Stay active and eat healthy.      I will see you next year, please call us  sooner if needed particularly if your blood pressure is slightly high.       ------

## 2012-09-26 NOTE — Telephone Encounter (Signed)
Letter sent. DJR  

## 2012-09-26 NOTE — Telephone Encounter (Signed)
Message copied by Eustace Quail on Mon Sep 26, 2012  8:18 AM ------      Message from: [name]      Created: Sun Sep 25, 2012 11:51 AM       Send a letter       [name] [name],      Your cholesterol is slightly higher than before, it went from 214 to 226. The LDL or bad cholesterol remains satisfactory at 128.      All the other tests: liver, kidney, blood count are thyroid are normal.      These are good results!      Stay active and eat healthy.      I will see you next year, please call us  sooner if needed particularly if your blood pressure is slightly high.       ------

## 2012-10-03 ENCOUNTER — Encounter: Payer: 59 | Admitting: Internal Medicine

## 2012-10-22 ENCOUNTER — Other Ambulatory Visit: Payer: Self-pay | Admitting: Internal Medicine

## 2012-10-24 ENCOUNTER — Other Ambulatory Visit: Payer: Self-pay | Admitting: *Deleted

## 2012-10-24 MED ORDER — RAMIPRIL 2.5 MG PO CAPS: ORAL_CAPSULE | ORAL | Status: DC

## 2012-10-24 NOTE — Telephone Encounter (Signed)
Ramipril refill sent to pharmacy

## 2013-04-20 ENCOUNTER — Other Ambulatory Visit: Payer: Self-pay | Admitting: Internal Medicine

## 2013-05-08 ENCOUNTER — Other Ambulatory Visit: Payer: Self-pay

## 2013-05-08 DIAGNOSIS — Z1231 Encounter for screening mammogram for malignant neoplasm of breast: Secondary | ICD-10-CM

## 2013-06-15 ENCOUNTER — Ambulatory Visit: Payer: 59

## 2013-06-20 ENCOUNTER — Encounter (INDEPENDENT_AMBULATORY_CARE_PROVIDER_SITE_OTHER): Payer: Self-pay

## 2013-06-20 ENCOUNTER — Ambulatory Visit
Admission: RE | Admit: 2013-06-20 | Discharge: 2013-06-20 | Disposition: A | Discharge status: Home / Self Care | Payer: BC Managed Care – PPO | Source: Ambulatory Visit

## 2013-06-20 DIAGNOSIS — Z1231 Encounter for screening mammogram for malignant neoplasm of breast: Secondary | ICD-10-CM

## 2013-06-28 ENCOUNTER — Other Ambulatory Visit: Payer: Self-pay | Admitting: Internal Medicine

## 2013-09-24 ENCOUNTER — Other Ambulatory Visit: Payer: Self-pay | Admitting: Internal Medicine

## 2013-11-01 ENCOUNTER — Encounter: Payer: Self-pay | Admitting: Internal Medicine

## 2013-11-01 ENCOUNTER — Ambulatory Visit (INDEPENDENT_AMBULATORY_CARE_PROVIDER_SITE_OTHER): Payer: BC Managed Care – PPO | Admitting: Internal Medicine

## 2013-11-01 VITALS — BP 134/87 | HR 76 | Temp 98.2°F | Ht 64.0 in | Wt 148.4 lb

## 2013-11-01 DIAGNOSIS — Z23 Encounter for immunization: Secondary | ICD-10-CM

## 2013-11-01 DIAGNOSIS — Z0000 Encounter for general adult medical examination without abnormal findings: Secondary | ICD-10-CM

## 2013-11-01 DIAGNOSIS — I10 Essential (primary) hypertension: Secondary | ICD-10-CM

## 2013-11-01 DIAGNOSIS — M1990 Unspecified osteoarthritis, unspecified site: Secondary | ICD-10-CM

## 2013-11-01 DIAGNOSIS — Z85828 Personal history of other malignant neoplasm of skin: Secondary | ICD-10-CM

## 2013-11-01 LAB — BASIC METABOLIC PANEL
BUN: 16 mg/dL (ref 6–23)
CO2: 29 mEq/L (ref 19–32)
Calcium: 9.7 mg/dL (ref 8.4–10.5)
Chloride: 103 mEq/L (ref 96–112)
Creatinine, Ser: 0.7 mg/dL (ref 0.4–1.2)
GFR: 90.26 mL/min (ref >60.00)
Glucose, Bld: 89 mg/dL (ref 70–99)
Potassium: 3.9 mEq/L (ref 3.5–5.1)
Sodium: 137 mEq/L (ref 135–145)

## 2013-11-01 LAB — LIPID PANEL
Cholesterol: 233 mg/dL — ABNORMAL HIGH (ref 0–200)
HDL: 74.6 mg/dL (ref >39.00)
LDL Cholesterol: 149 mg/dL — ABNORMAL HIGH (ref 0–99)
NonHDL: 158.4
Total CHOL/HDL Ratio: 3
Triglycerides: 47 mg/dL (ref 0.0–149.0)
VLDL: 9.4 mg/dL (ref 0.0–40.0)

## 2013-11-01 MED ORDER — RAMIPRIL 2.5 MG PO CAPS: ORAL_CAPSULE | ORAL | Status: DC

## 2013-11-01 NOTE — Progress Notes (Signed)
Subjective:    Patient ID: [name] [name], female    DOB: [birth year], 53 y.o.   MRN: [id number]  DOS:  11/01/2013 Type of visit - description : cpx  Interval history: feeling well  At the right index she has a small knot that is tender and would like to see a specialist  ROS Diet-- healthy  Exercise-- no routine exercise   No  CP, SOB Denies  nausea, vomiting diarrhea, blood in the stools No abdominal pain (-) cough, sputum production (-) wheezing, chest congestion No dysuria, gross hematuria, difficulty urinating  No anxiety, depression    Past Medical History  Diagnosis Date  . HTN (hypertension)   . MVP (mitral valve prolapse)   . Skin cancer, basal cell     sees derm q 6 months   . C2 cervical fracture 1986    no surgery, had a halo  . Normal cardiac stress test 04/05/2008    Negative    Past Surgical History  Procedure Laterality Date  . Dilation and curettage of uterus  2009  . Polypectomy      Uterus  . Palatal osteotomy      History   Social History  . Marital Status: Divorced    Spouse Name: N/A    Number of Children: 0  . Years of Education: N/A   Occupational History  . 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 Smoking status: Never Smoker   . Smokeless tobacco: Not on file  . Alcohol Use: 2.4 oz/week    4 Glasses of wine per week     Comment: socially  . Drug Use: Not on file  . Sexual Activity: Not on file   Other Topics Concern  . Not on file   Social History Narrative   Single, lives w/ b-friend     Family History  Problem Relation Age of Onset  . Coronary artery disease Neg Hx   . Cervical cancer Sister     [2] sisters[  . Breast cancer Maternal Grandmother   . COPD Father   . Hypertension Mother   . Seizures Brother   . Diabetes Neg Hx   . Colon cancer Neg Hx       Medication List       This list is accurate as of: 11/01/13  6:37 PM.  Always use your most recent med list.              aspirin 81 MG tablet  Take 81 mg by mouth daily.     fish oil-omega-3 fatty acids 1000 MG capsule  Take 1 g by mouth daily.     LYSINE PO  Take 1 each by mouth daily.     multivitamin,tx-minerals tablet  Take 1 tablet by mouth daily.     ramipril 2.5 MG capsule  Commonly known as:  ALTACE  Take 1 capsule daily.           Objective:   Physical Exam BP 134/87  Pulse 76  Temp(Src) 98.2 F (36.8 C) (Oral)  Ht 5\' 4"  (1.626 m)  Wt 148 lb 6 oz (67.302 kg)  BMI 25.46 kg/m2  SpO2 98%  LMP 01/19/2013 General -- alert, well-developed, NAD.  Neck --no thyromegaly , normal carotid pulse  HEENT-- Not pale.  Lungs -- normal respiratory effort, no intercostal retractions, no accessory muscle use, and normal breath sounds.  Heart-- normal rate, regular rhythm, no murmur.  Abdomen-- Not  distended, good bowel sounds,soft, non-tender. Extremities-- no pretibial edema bilaterally ; R index by the DIP has a 3-4 mm slt hard lump Neurologic--  alert & oriented X3. Speech normal, gait appropriate for age, strength symmetric and appropriate for age.  Psych-- Cognition and judgment appear intact. Cooperative with normal attention span and concentration. No anxious or depressed appearing.      Assessment & Plan:

## 2013-11-01 NOTE — Assessment & Plan Note (Addendum)
Td 10-12-2007 zostavax discussed before  Flu shot  Perimenopausal x 1 year-- rec Ca and vit D female check per gyn, PAP schedule for tomorrow,  had  MMG this year cscope (-) except for hemorrhoids 09-2011, next per Dr Sharlett Iles Labs   ekg-- nsr Diet-- doing well Exercise-- encouraged to have a routine, benefits discussed

## 2013-11-01 NOTE — Assessment & Plan Note (Signed)
Sees dermatology every 6 months.

## 2013-11-01 NOTE — Progress Notes (Signed)
Pre visit review using our clinic review tool, if applicable. No additional management support is needed unless otherwise documented below in the visit note. 

## 2013-11-01 NOTE — Assessment & Plan Note (Signed)
On ramipril, ambulatory BPs always less than 130. Plan: Refill medications

## 2013-11-01 NOTE — Assessment & Plan Note (Addendum)
See history of present illness, she probably has a synovial cyst in the right index, is causing discomfort, referred to hand surgery

## 2013-11-01 NOTE — Patient Instructions (Signed)
Get your blood work before you leave    Please come back to the office in 1 year  for a physical exam. Come back fasting    

## 2013-11-02 ENCOUNTER — Other Ambulatory Visit: Payer: Self-pay | Admitting: Obstetrics and Gynecology

## 2013-11-03 LAB — CYTOLOGY - PAP

## 2014-05-22 ENCOUNTER — Other Ambulatory Visit: Payer: Self-pay

## 2014-05-22 DIAGNOSIS — Z1231 Encounter for screening mammogram for malignant neoplasm of breast: Secondary | ICD-10-CM

## 2014-07-02 ENCOUNTER — Ambulatory Visit
Admission: RE | Admit: 2014-07-02 | Discharge: 2014-07-02 | Disposition: A | Discharge status: Home / Self Care | Payer: BLUE CROSS/BLUE SHIELD | Source: Ambulatory Visit

## 2014-07-02 DIAGNOSIS — Z1231 Encounter for screening mammogram for malignant neoplasm of breast: Secondary | ICD-10-CM

## 2014-11-05 ENCOUNTER — Ambulatory Visit (INDEPENDENT_AMBULATORY_CARE_PROVIDER_SITE_OTHER): Payer: BLUE CROSS/BLUE SHIELD | Admitting: Internal Medicine

## 2014-11-05 ENCOUNTER — Encounter: Payer: Self-pay | Admitting: Internal Medicine

## 2014-11-05 VITALS — BP 122/66 | HR 75 | Temp 98.1°F | Ht 64.25 in | Wt 153.2 lb

## 2014-11-05 DIAGNOSIS — Z114 Encounter for screening for human immunodeficiency virus [HIV]: Secondary | ICD-10-CM

## 2014-11-05 DIAGNOSIS — M545 Low back pain: Secondary | ICD-10-CM

## 2014-11-05 DIAGNOSIS — Z0000 Encounter for general adult medical examination without abnormal findings: Secondary | ICD-10-CM | POA: Diagnosis not present

## 2014-11-05 DIAGNOSIS — Z09 Encounter for follow-up examination after completed treatment for conditions other than malignant neoplasm: Secondary | ICD-10-CM

## 2014-11-05 DIAGNOSIS — Z1159 Encounter for screening for other viral diseases: Secondary | ICD-10-CM

## 2014-11-05 LAB — BASIC METABOLIC PANEL
BUN: 12 mg/dL (ref 6–23)
CO2: 27 mEq/L (ref 19–32)
Calcium: 9.1 mg/dL (ref 8.4–10.5)
Chloride: 105 mEq/L (ref 96–112)
Creatinine, Ser: 0.68 mg/dL (ref 0.40–1.20)
GFR: 96.05 mL/min (ref >60.00)
Glucose, Bld: 99 mg/dL (ref 70–99)
Potassium: 3.9 mEq/L (ref 3.5–5.1)
Sodium: 140 mEq/L (ref 135–145)

## 2014-11-05 LAB — LIPID PANEL
Cholesterol: 208 mg/dL — ABNORMAL HIGH (ref 0–200)
HDL: 77 mg/dL (ref >39.00)
LDL Cholesterol: 116 mg/dL — ABNORMAL HIGH (ref 0–99)
NonHDL: 130.69
Total CHOL/HDL Ratio: 3
Triglycerides: 73 mg/dL (ref 0.0–149.0)
VLDL: 14.6 mg/dL (ref 0.0–40.0)

## 2014-11-05 LAB — CBC WITH DIFFERENTIAL/PLATELET
Basophils Absolute: 0 10*3/uL (ref 0.0–0.1)
Basophils Relative: 0.7 % (ref 0.0–3.0)
Eosinophils Absolute: 0.1 10*3/uL (ref 0.0–0.7)
Eosinophils Relative: 3.3 % (ref 0.0–5.0)
HCT: 38.6 % (ref 36.0–46.0)
Hemoglobin: 13.1 g/dL (ref 12.0–15.0)
Lymphocytes Relative: 37.7 % (ref 12.0–46.0)
Lymphs Abs: 1.4 10*3/uL (ref 0.7–4.0)
MCHC: 33.9 g/dL (ref 30.0–36.0)
MCV: 90.2 fl (ref 78.0–100.0)
Monocytes Absolute: 0.2 10*3/uL (ref 0.1–1.0)
Monocytes Relative: 6.4 % (ref 3.0–12.0)
Neutro Abs: 1.9 10*3/uL (ref 1.4–7.7)
Neutrophils Relative %: 51.9 % (ref 43.0–77.0)
Platelets: 188 10*3/uL (ref 150.0–400.0)
RBC: 4.28 Mil/uL (ref 3.87–5.11)
RDW: 12.9 % (ref 11.5–15.5)
WBC: 3.7 10*3/uL — ABNORMAL LOW (ref 4.0–10.5)

## 2014-11-05 MED ORDER — RAMIPRIL 2.5 MG PO CAPS
2.5000 mg | ORAL_CAPSULE | Freq: Every day | ORAL | Status: DC
Start: 2014-11-05 — End: 2015-11-07

## 2014-11-05 NOTE — Progress Notes (Signed)
Pre visit review using our clinic review tool, if applicable. No additional management support is needed unless otherwise documented below in the visit note. 

## 2014-11-05 NOTE — Progress Notes (Signed)
Subjective:    Patient ID: [name] [name], female    DOB: [birth year], 54 y.o.   MRN: [id number]  DOS:  11/05/2014 Type of visit - description : CPX Interval history: Doing well, has no concerns except for some pain. See review of systems    Review of Systems Constitutional: No fever. No chills. No unexplained wt changes. No unusual sweats  HEENT: No dental problems, no ear discharge, no facial swelling, no voice changes. No eye discharge, no eye  redness , no  intolerance to light   Respiratory: No wheezing , no  difficulty breathing. No cough , no mucus production  Cardiovascular: No CP, no leg swelling , no  Palpitations  GI: no nausea, no vomiting, no diarrhea , no  abdominal pain.  No blood in the stools. No dysphagia, no odynophagia    Endocrine: No polyphagia, no polyuria , no polydipsia  GU: No dysuria, gross hematuria, difficulty urinating. No urinary urgency, no frequency.  Musculoskeletal: About one year history of persistent right-sided back pain with occasional radiation to the right buttock, the pain is almost constant, slightly worse by bending or twisting her torso. No radiation to the distal lower extremity. Walking does not bother her much. Also, right elbow pain, flexor aspect, increased with hand pronation,  Skin: No change in the color of the skin, palor , no  Rash  Allergic, immunologic: No environmental allergies , no  food allergies  Neurological: No dizziness no  syncope. No headaches. No diplopia, no slurred, no slurred speech, no motor deficits, no facial  Numbness  Hematological: No enlarged lymph nodes, no easy bruising , no unusual bleedings  Psychiatry: No suicidal ideas, no hallucinations, no beavior problems, no confusion.  No unusual/severe anxiety, no depression   Past Medical History  Diagnosis Date  . HTN (hypertension)   . MVP (mitral valve prolapse)   . Skin cancer, basal cell     sees derm q 6 months   . C2 cervical fracture  ([place]) 1986    no surgery, had a halo  . Normal cardiac stress test 04/05/2008    Negative    Past Surgical History  Procedure Laterality Date  . Dilation and curettage of uterus  2009  . Polypectomy      Uterus  . Palatal osteotomy      Social History   Social History  . Marital Status: Divorced    Spouse Name: N/A  . Number of Children: 0  . Years of Education: N/A   Occupational History  . 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 Smoking status: Never Smoker   . Smokeless tobacco: Not on file  . Alcohol Use: 2.4 oz/week    4 Glasses of wine per week     Comment: socially  . Drug Use: Not on file  . Sexual Activity: Not on file   Other Topics Concern  . Not on file   Social History Narrative   Single, lives w/ b-friend     Family History  Problem Relation Age of Onset  . Coronary artery disease Neg Hx   . Cervical cancer Sister     [2] sisters[  . Breast cancer Maternal Grandmother   . COPD Father   . Hypertension Mother   . Seizures Brother   . Diabetes Neg Hx   . Colon cancer Neg Hx        Medication List  This list is accurate as of: 11/05/14  1:30 PM.  Always use your most recent med list.               aspirin 81 MG tablet  Take 81 mg by mouth daily.     fish oil-omega-3 fatty acids 1000 MG capsule  Take 1 g by mouth daily.     LYSINE PO  Take 1 each by mouth daily.     multivitamin,tx-minerals tablet  Take 1 tablet by mouth daily.     ramipril 2.5 MG capsule  Commonly known as:  ALTACE  Take 1 capsule (2.5 mg total) by mouth daily.           Objective:   Physical Exam BP 122/66 mmHg  Pulse 75  Temp(Src) 98.1 F (36.7 C) (Oral)  Ht 5' 4.25" (1.632 m)  Wt 153 lb 4 oz (69.514 kg)  BMI 26.10 kg/m2  SpO2 99% General:   Well developed, well nourished . NAD.  Neck:  No thyromegaly HEENT:  Normocephalic . Face symmetric, atraumatic Lungs:  CTA B Normal respiratory effort, no  intercostal retractions, no accessory muscle use. Heart: RRR,  no murmur.  No pretibial edema bilaterally  Abdomen:  Not distended, soft, non-tender. No rebound or rigidity.   MSK: Elbows normal to inspection, palpation. Back: No TTP. Hip rotation full without problems, no tender at the trochanteric bursa. Skin: Exposed areas without rash. Not pale. Not jaundice Neurologic:  alert & oriented X3.  Speech normal, gait appropriate for age and unassisted Strength symmetric and appropriate for age. DTRs symmetric Psych: Cognition and judgment appear intact.  Cooperative with normal attention span and concentration.  Behavior appropriate. No anxious or depressed appearing.    Assessment & Plan:   Assessment> HTN MVP BCC skin cancer-- derm q6 months C2 cervical Fx 1986, no surgery, had a halo Normal stress test 2010  Plan HTN: RF medication, labs. Continue Altace. BP today is very good MSK:  Right-sided back pain, neurological exam normal but pain is persistent, request a referral. Will send to  sports medicine Elbow pain: Started after a trip to [place], she did some heavy lifting of luggage, likely a strain. Recommend OTC Motrin. RTC one year for a CPX

## 2014-11-05 NOTE — Assessment & Plan Note (Signed)
HTN: RF medication, labs. Continue Altace. BP today is very good MSK:  Right-sided back pain, neurological exam normal but pain is persistent, request a referral. Will send to  sports medicine Elbow pain: Started after a trip to [place], she did some heavy lifting of luggage, likely a strain. Recommend OTC Motrin. RTC one year for a CPX

## 2014-11-05 NOTE — Patient Instructions (Signed)
Get your blood work before you leave      Next visit  for a physical exam in one year, fasting    Please schedule an appointment at the front desk

## 2014-11-05 NOTE — Assessment & Plan Note (Addendum)
Td 10-12-2007 zostavax discussed before  Had a Flu shot  Menopausal ~ 2015- rec   Ca and vit D female check per gyn, PAP - [facility] cscope (-) except for hemorrhoids 09-2011, next per Dr Sharlett Iles   Diet and exercise-- encouraged - discussed

## 2014-11-06 LAB — HEPATITIS C ANTIBODY: HCV Ab: NEGATIVE

## 2014-11-06 LAB — HIV ANTIBODY (ROUTINE TESTING W REFLEX): HIV 1&2 Ab, 4th Generation: NONREACTIVE

## 2014-11-08 ENCOUNTER — Ambulatory Visit: Payer: BLUE CROSS/BLUE SHIELD | Admitting: Family Medicine

## 2014-11-09 ENCOUNTER — Encounter: Payer: Self-pay | Admitting: Family Medicine

## 2014-11-09 ENCOUNTER — Ambulatory Visit (INDEPENDENT_AMBULATORY_CARE_PROVIDER_SITE_OTHER): Payer: BLUE CROSS/BLUE SHIELD | Admitting: Family Medicine

## 2014-11-09 VITALS — BP 133/90 | HR 62 | Ht 64.0 in | Wt 151.0 lb

## 2014-11-09 DIAGNOSIS — M5441 Lumbago with sciatica, right side: Secondary | ICD-10-CM | POA: Diagnosis not present

## 2014-11-09 NOTE — Patient Instructions (Signed)
Your symptoms are consistent with a mild nerve root irritation of the lumbar spine - related to mild arthritis, disc bulging of the low back. You can take tylenol for baseline pain relief (1-2 extra strength tabs 3x/day) Consider a prednisone dose pack. Aleve 2 tabs twice a day with food for pain and inflammation. Consider muscle relaxant, pain medication if needed. Stay as active as possible. Physical therapy is the most important part of treatment - do this and home exercises on days you don't go to therapy. If not improving, will consider further imaging (x-rays, MRI), prednisone dose pack. Follow up with me in 6 weeks.

## 2014-11-13 ENCOUNTER — Telehealth: Payer: Self-pay | Admitting: Family Medicine

## 2014-11-13 DIAGNOSIS — M545 Low back pain, unspecified: Secondary | ICD-10-CM | POA: Insufficient documentation

## 2014-11-13 NOTE — Assessment & Plan Note (Signed)
some radiation into right buttock.  More consistent with mild lumbar nerve root irritation.  She will start with physical therapy, home exercises, aleve.  F/u in 6 weeks.  Consider imaging, prednisone if not improving.

## 2014-11-13 NOTE — Progress Notes (Signed)
PCP and referred by: Kathlene November, MD  Subjective:   HPI: Patient is a 54 y.o. female here for low back pain.  Patient reports for about 1 year she's had right sided low back pain. Recalls a fall when avoiding stepping on a cat - unsure if this caused her current pain though. Pain level 1/10 currently, up to 5/10. Radiates into right buttock, sharp. Noreene Filbert, chiropractic care. Pain is more constant now. No current numbness or tingling. No bowel/bladder dysfunction. No skin changes, fever, other complaints.  Past Medical History  Diagnosis Date  . HTN (hypertension)   . MVP (mitral valve prolapse)   . Skin cancer, basal cell     sees derm q 6 months   . C2 cervical fracture ([place]) 1986    no surgery, had a halo  . Normal cardiac stress test 04/05/2008    Negative    Current Outpatient Prescriptions on File Prior to Visit  Medication Sig Dispense Refill  . aspirin 81 MG tablet Take 81 mg by mouth daily.      . fish oil-omega-3 fatty acids 1000 MG capsule Take 1 g by mouth daily.      Marland Kitchen LYSINE PO Take 1 each by mouth daily.      . Multiple Vitamins-Minerals (MULTIVITAMIN,TX-MINERALS) tablet Take 1 tablet by mouth daily.      . ramipril (ALTACE) 2.5 MG capsule Take 1 capsule (2.5 mg total) by mouth daily. 90 capsule 3   No current facility-administered medications on file prior to visit.    Past Surgical History  Procedure Laterality Date  . Dilation and curettage of uterus  2009  . Polypectomy      Uterus  . Palatal osteotomy      Allergies  Allergen Reactions  . Penicillins     ? Reaction @ age 7  . Spironolactone     REACTION: blurred vision  . Sulfonamide Derivatives     ? Reaction @ age4    Social History   Social History  . Marital Status: Divorced    Spouse Name: N/A  . Number of Children: 0  . Years of Education: N/A   Occupational History  . 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 Smoking status: Never Smoker    . Smokeless tobacco: Not on file  . Alcohol Use: 2.4 oz/week    4 Glasses of wine per week     Comment: socially  . Drug Use: Not on file  . Sexual Activity: Not on file   Other Topics Concern  . Not on file   Social History Narrative   Single, lives w/ b-friend    Family History  Problem Relation Age of Onset  . Coronary artery disease Neg Hx   . Cervical cancer Sister     [2] sisters[  . Breast cancer Maternal Grandmother   . COPD Father   . Hypertension Mother   . Seizures Brother   . Diabetes Neg Hx   . Colon cancer Neg Hx     BP 133/90 mmHg  Pulse 62  Ht 5\' 4"  (1.626 m)  Wt 151 lb (68.493 kg)  BMI 25.91 kg/m2  Review of Systems: See HPI above.    Objective:  Physical Exam:  Gen: NAD  Back: No gross deformity, scoliosis. TTP right lumbar paraspinal region.  No midline or bony TTP. FROM. Strength LEs 5/5 all muscle groups.   2+ MSRs in patellar and achilles tendons,  equal bilaterally. Negative SLRs. Sensation intact to light touch bilaterally.  Right hip: Negative logroll bilateral hips Negative fabers and piriformis stretches.    Assessment & Plan:  1. Right low back pain - some radiation into right buttock.  More consistent with mild lumbar nerve root irritation.  She will start with physical therapy, home exercises, aleve.  F/u in 6 weeks.  Consider imaging, prednisone if not improving.

## 2014-12-21 ENCOUNTER — Encounter: Payer: Self-pay | Admitting: Family Medicine

## 2014-12-21 ENCOUNTER — Ambulatory Visit (INDEPENDENT_AMBULATORY_CARE_PROVIDER_SITE_OTHER): Payer: BLUE CROSS/BLUE SHIELD | Admitting: Family Medicine

## 2014-12-21 VITALS — BP 135/87 | HR 83 | Ht 64.0 in | Wt 150.0 lb

## 2014-12-21 DIAGNOSIS — M5441 Lumbago with sciatica, right side: Secondary | ICD-10-CM

## 2014-12-21 NOTE — Patient Instructions (Signed)
We will go ahead with an MRI of your lumbar spine. I will call you the business day following the MRI to go over results and next steps.

## 2014-12-25 NOTE — Assessment & Plan Note (Signed)
with radiation into right buttock and some into leg, thigh.  Not improving with physical therapy, home exercises, aleve.  Will go ahead with MRI to assess for disc herniation.

## 2014-12-25 NOTE — Progress Notes (Addendum)
PCP and referred by: Kathlene November, MD  Subjective:   HPI: Patient is a 54 y.o. female here for low back pain.  10/21: Patient reports for about 1 year she's had right sided low back pain. Recalls a fall when avoiding stepping on a cat - unsure if this caused her current pain though. Pain level 1/10 currently, up to 5/10. Radiates into right buttock, sharp. Noreene Filbert, chiropractic care. Pain is more constant now. No current numbness or tingling. No bowel/bladder dysfunction. No skin changes, fever, other complaints.  12/2: Patient reports her pain is at a 2/10 level, sharp. Still radiating pain right lumbar spine with burning into right glut and thigh area. No numbness or weakness. Worse with movements of low back - causes pain to go down the leg. No skin changes, fever. No bowel/bladder dysfunction.  Past Medical History  Diagnosis Date  . HTN (hypertension)   . MVP (mitral valve prolapse)   . Skin cancer, basal cell     sees derm q 6 months   . C2 cervical fracture ([place]) 1986    no surgery, had a halo  . Normal cardiac stress test 04/05/2008    Negative    Current Outpatient Prescriptions on File Prior to Visit  Medication Sig Dispense Refill  . aspirin 81 MG tablet Take 81 mg by mouth daily.      . fish oil-omega-3 fatty acids 1000 MG capsule Take 1 g by mouth daily.      Marland Kitchen LYSINE PO Take 1 each by mouth daily.      . Multiple Vitamins-Minerals (MULTIVITAMIN,TX-MINERALS) tablet Take 1 tablet by mouth daily.      . ramipril (ALTACE) 2.5 MG capsule Take 1 capsule (2.5 mg total) by mouth daily. 90 capsule 3   No current facility-administered medications on file prior to visit.    Past Surgical History  Procedure Laterality Date  . Dilation and curettage of uterus  2009  . Polypectomy      Uterus  . Palatal osteotomy      Allergies  Allergen Reactions  . Penicillins     ? Reaction @ age 75  . Spironolactone     REACTION: blurred vision  . Sulfonamide  Derivatives     ? Reaction @ age4    Social History   Social History  . Marital Status: Divorced    Spouse Name: N/A  . Number of Children: 0  . Years of Education: N/A   Occupational History  . 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 Smoking status: Never Smoker   . Smokeless tobacco: Not on file  . Alcohol Use: 2.4 oz/week    4 Glasses of wine per week     Comment: socially  . Drug Use: Not on file  . Sexual Activity: Not on file   Other Topics Concern  . Not on file   Social History Narrative   Single, lives w/ b-friend    Family History  Problem Relation Age of Onset  . Coronary artery disease Neg Hx   . Cervical cancer Sister     [2] sisters[  . Breast cancer Maternal Grandmother   . COPD Father   . Hypertension Mother   . Seizures Brother   . Diabetes Neg Hx   . Colon cancer Neg Hx     BP 135/87 mmHg  Pulse 83  Ht 5\' 4"  (1.626 m)  Wt 150 lb (68.04 kg)  BMI  25.73 kg/m2  Review of Systems: See HPI above.    Objective:  Physical Exam:  Gen: NAD  Back: No gross deformity, scoliosis. TTP right lumbar paraspinal region.  No midline or bony TTP. FROM. Strength LEs 5/5 all muscle groups.   2+ MSRs in patellar and achilles tendons, equal bilaterally. Negative SLRs. Sensation intact to light touch bilaterally.  Right hip: Negative logroll bilateral hips Negative fabers and piriformis stretches.    Assessment & Plan:  1. Right low back pain - with radiation into right buttock and some into leg, thigh.  Not improving with physical therapy, home exercises, aleve.  Will go ahead with MRI to assess for disc herniation.  Addendum:  MRI reviewed and discussed with patient.  MRI shows foraminal narrowing at L4-5 both sides but her symptoms are only on the right.  We discussed options - she will continue with conservative treatment, be more aggressive with her rehab and in the gym.  She will call us if she would like to try  ESI at this level on the right otherwise follow up with [place] as needed.

## 2014-12-29 ENCOUNTER — Ambulatory Visit (HOSPITAL_BASED_OUTPATIENT_CLINIC_OR_DEPARTMENT_OTHER)
Admission: RE | Admit: 2014-12-29 | Discharge: 2014-12-29 | Disposition: A | Discharge status: Home / Self Care | Payer: BLUE CROSS/BLUE SHIELD | Source: Ambulatory Visit | Attending: Family Medicine | Admitting: Family Medicine

## 2014-12-29 DIAGNOSIS — M5136 Other intervertebral disc degeneration, lumbar region: Secondary | ICD-10-CM | POA: Insufficient documentation

## 2014-12-29 DIAGNOSIS — M5441 Lumbago with sciatica, right side: Secondary | ICD-10-CM | POA: Diagnosis present

## 2014-12-29 DIAGNOSIS — M47896 Other spondylosis, lumbar region: Secondary | ICD-10-CM | POA: Diagnosis not present

## 2015-01-24 NOTE — Telephone Encounter (Signed)
Finished

## 2015-05-27 ENCOUNTER — Other Ambulatory Visit: Payer: Self-pay

## 2015-05-27 DIAGNOSIS — Z1231 Encounter for screening mammogram for malignant neoplasm of breast: Secondary | ICD-10-CM

## 2015-05-28 DIAGNOSIS — Z85828 Personal history of other malignant neoplasm of skin: Secondary | ICD-10-CM | POA: Diagnosis not present

## 2015-05-28 DIAGNOSIS — L438 Other lichen planus: Secondary | ICD-10-CM | POA: Diagnosis not present

## 2015-05-28 DIAGNOSIS — L570 Actinic keratosis: Secondary | ICD-10-CM | POA: Diagnosis not present

## 2015-05-28 DIAGNOSIS — L918 Other hypertrophic disorders of the skin: Secondary | ICD-10-CM | POA: Diagnosis not present

## 2015-05-28 DIAGNOSIS — L821 Other seborrheic keratosis: Secondary | ICD-10-CM | POA: Diagnosis not present

## 2015-07-03 ENCOUNTER — Ambulatory Visit
Admission: RE | Admit: 2015-07-03 | Discharge: 2015-07-03 | Disposition: A | Discharge status: Home / Self Care | Payer: BLUE CROSS/BLUE SHIELD | Source: Ambulatory Visit

## 2015-07-03 ENCOUNTER — Other Ambulatory Visit: Payer: Self-pay | Admitting: Obstetrics and Gynecology

## 2015-07-03 DIAGNOSIS — Z1231 Encounter for screening mammogram for malignant neoplasm of breast: Secondary | ICD-10-CM

## 2015-07-05 ENCOUNTER — Other Ambulatory Visit: Payer: Self-pay | Admitting: Obstetrics and Gynecology

## 2015-07-05 DIAGNOSIS — R928 Other abnormal and inconclusive findings on diagnostic imaging of breast: Secondary | ICD-10-CM

## 2015-07-16 ENCOUNTER — Ambulatory Visit
Admission: RE | Admit: 2015-07-16 | Discharge: 2015-07-16 | Disposition: A | Discharge status: Home / Self Care | Payer: BLUE CROSS/BLUE SHIELD | Source: Ambulatory Visit | Attending: Obstetrics and Gynecology | Admitting: Obstetrics and Gynecology

## 2015-07-16 DIAGNOSIS — R928 Other abnormal and inconclusive findings on diagnostic imaging of breast: Secondary | ICD-10-CM

## 2015-07-16 DIAGNOSIS — R922 Inconclusive mammogram: Secondary | ICD-10-CM | POA: Diagnosis not present

## 2015-07-16 DIAGNOSIS — N6001 Solitary cyst of right breast: Secondary | ICD-10-CM | POA: Diagnosis not present

## 2015-07-16 DIAGNOSIS — N6011 Diffuse cystic mastopathy of right breast: Secondary | ICD-10-CM | POA: Diagnosis not present

## 2015-10-30 DIAGNOSIS — Z23 Encounter for immunization: Secondary | ICD-10-CM | POA: Diagnosis not present

## 2015-11-07 ENCOUNTER — Encounter: Payer: Self-pay | Admitting: Internal Medicine

## 2015-11-07 ENCOUNTER — Ambulatory Visit (INDEPENDENT_AMBULATORY_CARE_PROVIDER_SITE_OTHER): Payer: BLUE CROSS/BLUE SHIELD | Admitting: Internal Medicine

## 2015-11-07 DIAGNOSIS — Z0000 Encounter for general adult medical examination without abnormal findings: Secondary | ICD-10-CM

## 2015-11-07 LAB — BASIC METABOLIC PANEL
BUN: 13 mg/dL (ref 6–23)
CO2: 31 mEq/L (ref 19–32)
Calcium: 9.6 mg/dL (ref 8.4–10.5)
Chloride: 103 mEq/L (ref 96–112)
Creatinine, Ser: 0.77 mg/dL (ref 0.40–1.20)
GFR: 82.9 mL/min (ref >60.00)
Glucose, Bld: 86 mg/dL (ref 70–99)
Potassium: 3.7 mEq/L (ref 3.5–5.1)
Sodium: 141 mEq/L (ref 135–145)

## 2015-11-07 LAB — LIPID PANEL
Cholesterol: 234 mg/dL — ABNORMAL HIGH (ref 0–200)
HDL: 79.4 mg/dL (ref >39.00)
LDL Cholesterol: 137 mg/dL — ABNORMAL HIGH (ref 0–99)
NonHDL: 154.83
Total CHOL/HDL Ratio: 3
Triglycerides: 87 mg/dL (ref 0.0–149.0)
VLDL: 17.4 mg/dL (ref 0.0–40.0)

## 2015-11-07 LAB — AST: AST: 25 U/L (ref 0–37)

## 2015-11-07 LAB — TSH: TSH: 1.29 u[IU]/mL (ref 0.35–4.50)

## 2015-11-07 LAB — ALT: ALT: 26 U/L (ref 0–35)

## 2015-11-07 MED ORDER — RAMIPRIL 2.5 MG PO CAPS: 2.5000 mg | ORAL_CAPSULE | Freq: Every day | ORAL | 3 refills | Status: DC

## 2015-11-07 NOTE — Progress Notes (Signed)
Subjective:    Patient ID: [name] [name], female    DOB: 1960/12/01, 55 y.o.   MRN: [id number]  DOS:  11/07/2015 Type of visit - description : cpx Interval history: Good medication compliance, ambulatory BPs within normal when checked   Review of Systems  Constitutional: No fever. No chills. No unexplained wt changes. No unusual sweats  HEENT: No dental problems, no ear discharge, no facial swelling, no voice changes. No eye discharge, no eye  redness , no  intolerance to light   Respiratory: No wheezing , no  difficulty breathing. No cough , no mucus production  Cardiovascular: No CP, no leg swelling , no  Palpitations  GI: no nausea, no vomiting, no diarrhea , no  abdominal pain.  No blood in the stools. No dysphagia, no odynophagia    Endocrine: No polyphagia, no polyuria , no polydipsia  GU: No dysuria, gross hematuria, difficulty urinating. No urinary urgency, no frequency.  Musculoskeletal: No joint swellings or unusual aches or pains  Skin: No change in the color of the skin, palor , no  Rash  Allergic, immunologic: No environmental allergies , no  food allergies  Neurological: No dizziness no  syncope. No headaches. No diplopia, no slurred, no slurred speech, no motor deficits, no facial  Numbness  Hematological: No enlarged lymph nodes, no easy bruising , no unusual bleedings  Psychiatry: No suicidal ideas, no hallucinations, no beavior problems, no confusion.  No unusual/severe anxiety, no depression  Past Medical History:  Diagnosis Date  . C2 cervical fracture ([place]) 1986   no surgery, had a halo  . HTN (hypertension)   . MVP (mitral valve prolapse)   . Normal cardiac stress test 04/05/2008   Negative  . Skin cancer, basal cell    sees derm q 6 months     Past Surgical History:  Procedure Laterality Date  . DILATION AND CURETTAGE OF UTERUS  2009  . PALATAL OSTEOTOMY    . POLYPECTOMY     Uterus    Social History   Social History  . Marital  status: Divorced    Spouse name: N/A  . Number of children: 0  . Years of education: N/A   Occupational History  . 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 Smoking status: Never Smoker  . Smokeless tobacco: Never Used  . Alcohol use 2.4 oz/week    4 Glasses of wine per week     Comment: socially  . Drug use: Unknown  . Sexual activity: Not on file   Other Topics Concern  . Not on file   Social History Narrative   Single, lives w/ b-friend     Family History  Problem Relation Age of Onset  . Cervical cancer Sister     [2] sisters  . Breast cancer Maternal Grandmother 25  . COPD Father   . Hypertension Mother   . Seizures Brother   . Coronary artery disease Neg Hx   . Diabetes Neg Hx   . Colon cancer Neg Hx        Medication List       Accurate as of 11/07/15  6:01 PM. Always use your most recent med list.          aspirin 81 MG tablet Take 81 mg by mouth daily.   fish oil-omega-3 fatty acids 1000 MG capsule Take 1 g by mouth daily.   LYSINE PO Take 1 each by mouth  daily.   multivitamin,tx-minerals tablet Take 1 tablet by mouth daily.   ramipril 2.5 MG capsule Commonly known as:  ALTACE Take 1 capsule (2.5 mg total) by mouth daily.          Objective:   Physical Exam BP 126/74 (BP Location: Left Arm, Patient Position: Sitting, Cuff Size: Normal)   Pulse 75   Temp 98.5 F (36.9 C) (Oral)   Resp 14   Ht 5\' 4"  (1.626 m)   Wt 152 lb 8 oz (69.2 kg)   SpO2 97%   BMI 26.18 kg/m   General:   Well developed, well nourished . NAD.  Neck: No  thyromegaly  HEENT:  Normocephalic . Face symmetric, atraumatic Lungs:  CTA B Normal respiratory effort, no intercostal retractions, no accessory muscle use. Heart: RRR,  no murmur.  No pretibial edema bilaterally  Abdomen:  Not distended, soft, non-tender. No rebound or rigidity.   Skin: Exposed areas without rash. Not pale. Not jaundice Neurologic:  alert & oriented  X3.  Speech normal, gait appropriate for age and unassisted Strength symmetric and appropriate for age.  Psych: Cognition and judgment appear intact.  Cooperative with normal attention span and concentration.  Behavior appropriate. No anxious or depressed appearing.    Assessment & Plan:   Assessment> HTN MVP BCC skin cancer-- derm q6 months C2 cervical Fx 1986, no surgery, had a halo Normal stress test 2010  PLAN: HTN: Continue Altace, check ambulatory BPs. RTC one year

## 2015-11-07 NOTE — Assessment & Plan Note (Signed)
HTN: Continue Altace, check ambulatory BPs. RTC one year

## 2015-11-07 NOTE — Progress Notes (Signed)
Pre visit review using our clinic review tool, if applicable. No additional management support is needed unless otherwise documented below in the visit note. 

## 2015-11-07 NOTE — Patient Instructions (Signed)
GO TO THE LAB : Get the blood work     GO TO THE FRONT DESK Schedule your next appointment for a physical exam in 1 year   Check the  blood pressure  monthly   Be sure your blood pressure is between 110/65 and  135/85. If it is consistently higher or lower, let me know    

## 2015-11-07 NOTE — Assessment & Plan Note (Addendum)
Td  10-12-2007;  Had a flu shot  zostavax discussed today  Female care   Menopausal ~ 2015- rec   Ca and vit D  PAP - schedule for ~ 01-2016, had a MMG this year cscope (-) except for hemorrhoids 09-2011, next per Dr Sharlett Iles   Diet and exercise-- encouraged - discussed

## 2015-12-02 DIAGNOSIS — D225 Melanocytic nevi of trunk: Secondary | ICD-10-CM | POA: Diagnosis not present

## 2015-12-02 DIAGNOSIS — D2371 Other benign neoplasm of skin of right lower limb, including hip: Secondary | ICD-10-CM | POA: Diagnosis not present

## 2015-12-02 DIAGNOSIS — Z85828 Personal history of other malignant neoplasm of skin: Secondary | ICD-10-CM | POA: Diagnosis not present

## 2015-12-02 DIAGNOSIS — L821 Other seborrheic keratosis: Secondary | ICD-10-CM | POA: Diagnosis not present

## 2015-12-02 DIAGNOSIS — L812 Freckles: Secondary | ICD-10-CM | POA: Diagnosis not present

## 2015-12-02 DIAGNOSIS — L570 Actinic keratosis: Secondary | ICD-10-CM | POA: Diagnosis not present

## 2016-01-22 DIAGNOSIS — Z6826 Body mass index (BMI) 26.0-26.9, adult: Secondary | ICD-10-CM | POA: Diagnosis not present

## 2016-01-22 DIAGNOSIS — Z01419 Encounter for gynecological examination (general) (routine) without abnormal findings: Secondary | ICD-10-CM | POA: Diagnosis not present

## 2016-06-02 DIAGNOSIS — L821 Other seborrheic keratosis: Secondary | ICD-10-CM | POA: Diagnosis not present

## 2016-06-02 DIAGNOSIS — D225 Melanocytic nevi of trunk: Secondary | ICD-10-CM | POA: Diagnosis not present

## 2016-06-02 DIAGNOSIS — D2272 Melanocytic nevi of left lower limb, including hip: Secondary | ICD-10-CM | POA: Diagnosis not present

## 2016-06-02 DIAGNOSIS — L570 Actinic keratosis: Secondary | ICD-10-CM | POA: Diagnosis not present

## 2016-06-02 DIAGNOSIS — Z85828 Personal history of other malignant neoplasm of skin: Secondary | ICD-10-CM | POA: Diagnosis not present

## 2016-06-23 ENCOUNTER — Other Ambulatory Visit: Payer: Self-pay | Admitting: Obstetrics and Gynecology

## 2016-06-23 DIAGNOSIS — Z1231 Encounter for screening mammogram for malignant neoplasm of breast: Secondary | ICD-10-CM

## 2016-07-21 ENCOUNTER — Ambulatory Visit
Admission: RE | Admit: 2016-07-21 | Discharge: 2016-07-21 | Disposition: A | Discharge status: Home / Self Care | Payer: BLUE CROSS/BLUE SHIELD | Source: Ambulatory Visit | Attending: Obstetrics and Gynecology | Admitting: Obstetrics and Gynecology

## 2016-07-21 DIAGNOSIS — Z1231 Encounter for screening mammogram for malignant neoplasm of breast: Secondary | ICD-10-CM

## 2016-10-15 DIAGNOSIS — H5319 Other subjective visual disturbances: Secondary | ICD-10-CM | POA: Diagnosis not present

## 2016-10-21 IMAGING — MG MM DIGITAL SCREENING BILAT W/ TOMO W/ CAD
9 of 12 series · 9 of 28 positions shown · non-contrast
Comparison: Previous exam(s).

CLINICAL DATA: Screening.

EXAM:
2D DIGITAL SCREENING BILATERAL MAMMOGRAM WITH CAD AND ADJUNCT TOMO

[L CC synth-2D]
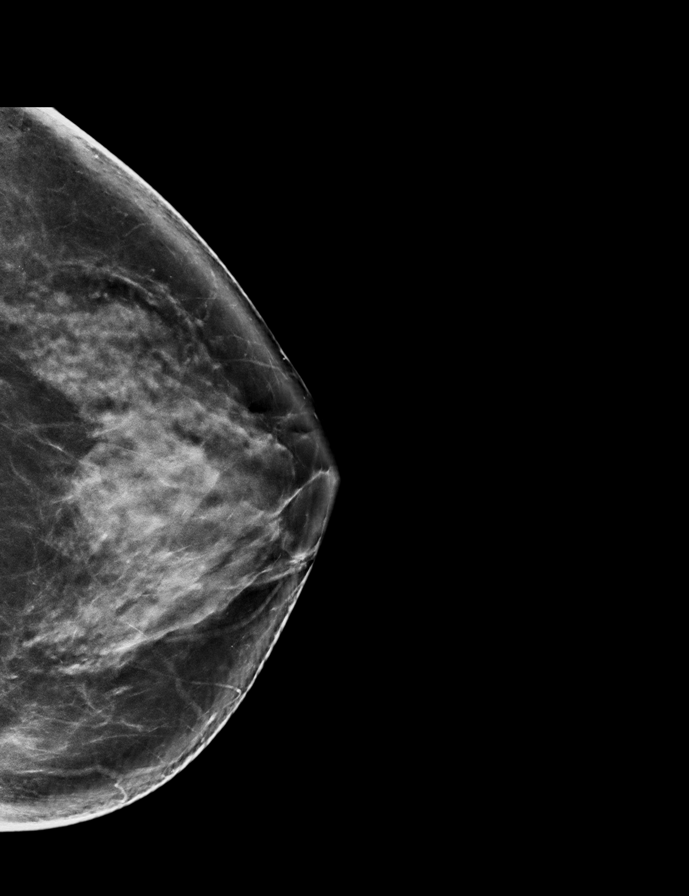

[R CC]
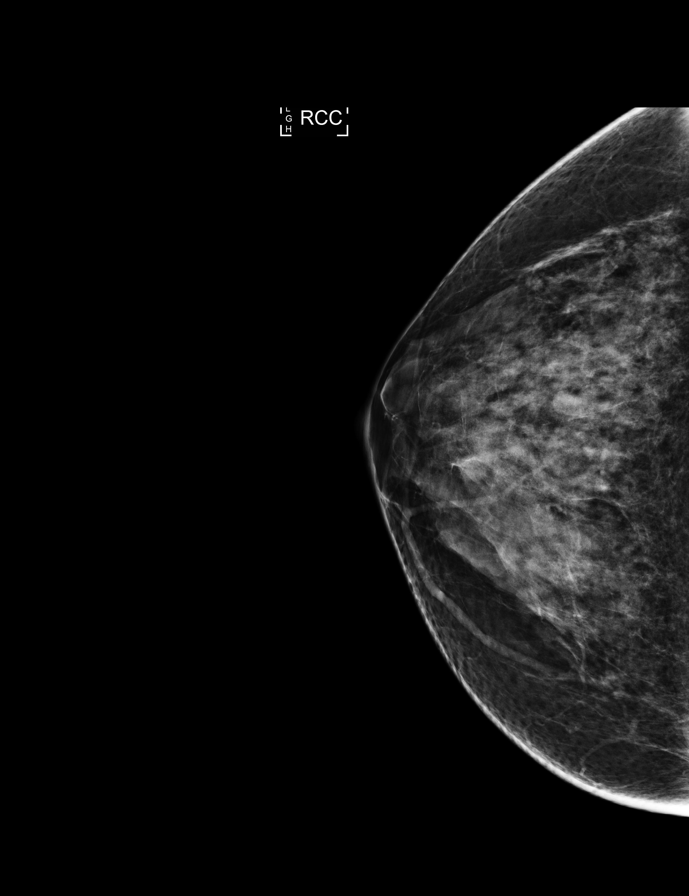

[L CC]
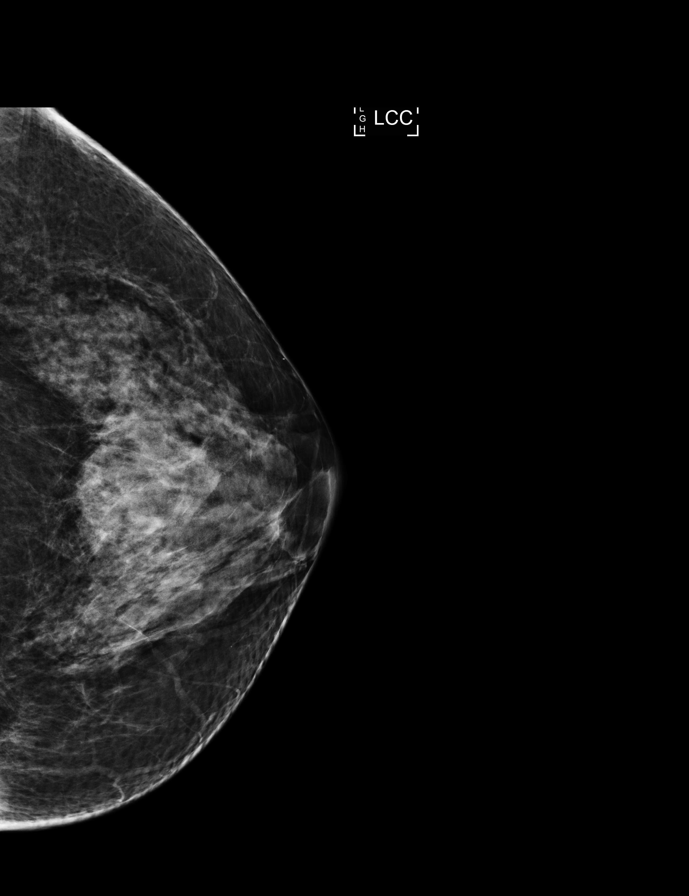

[R MLO synth-2D]
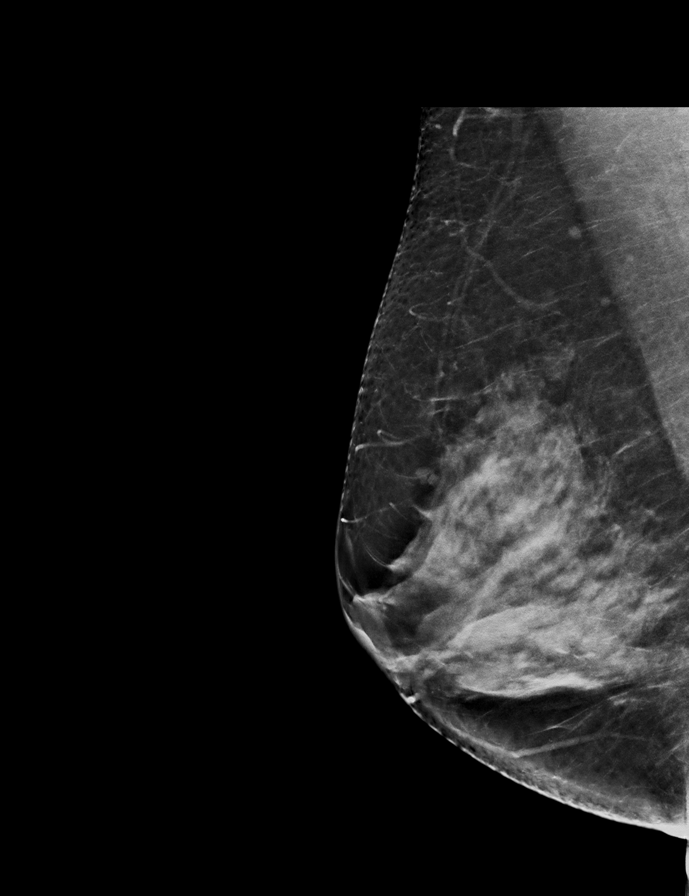

[R MLO]
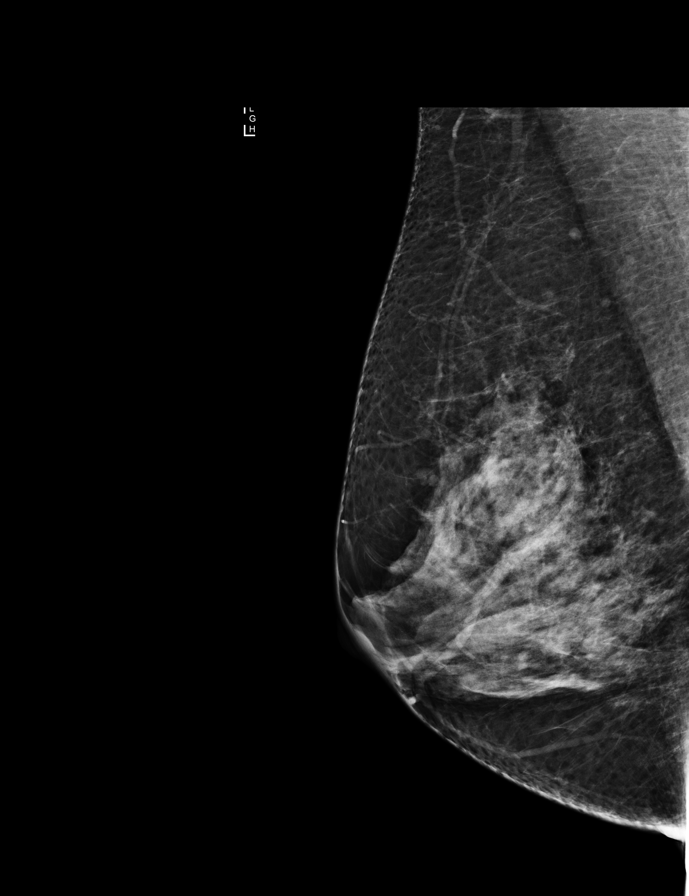

[L MLO synth-2D]
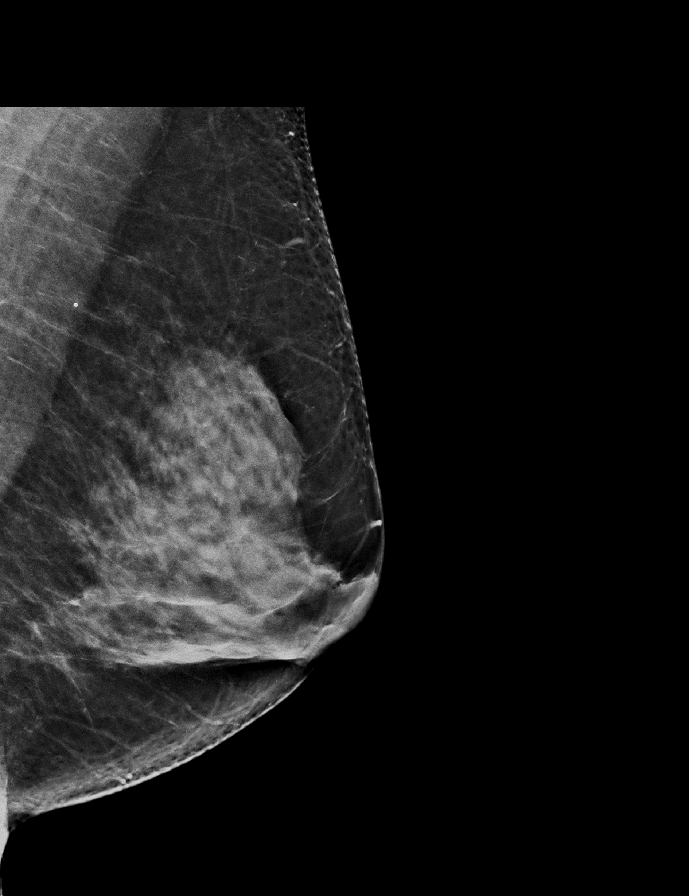

[L MLO]
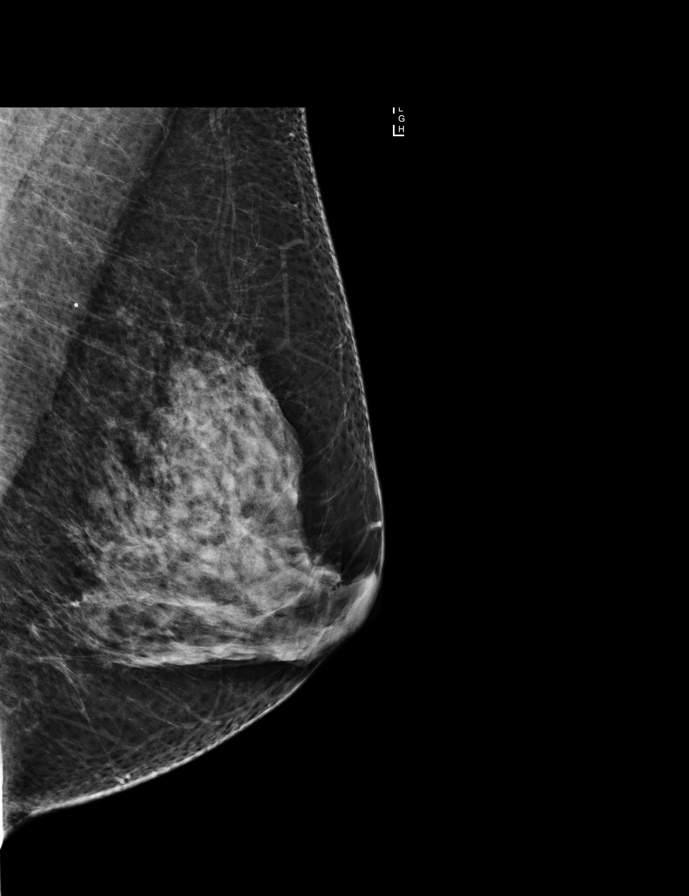

[R CC synth-2D]
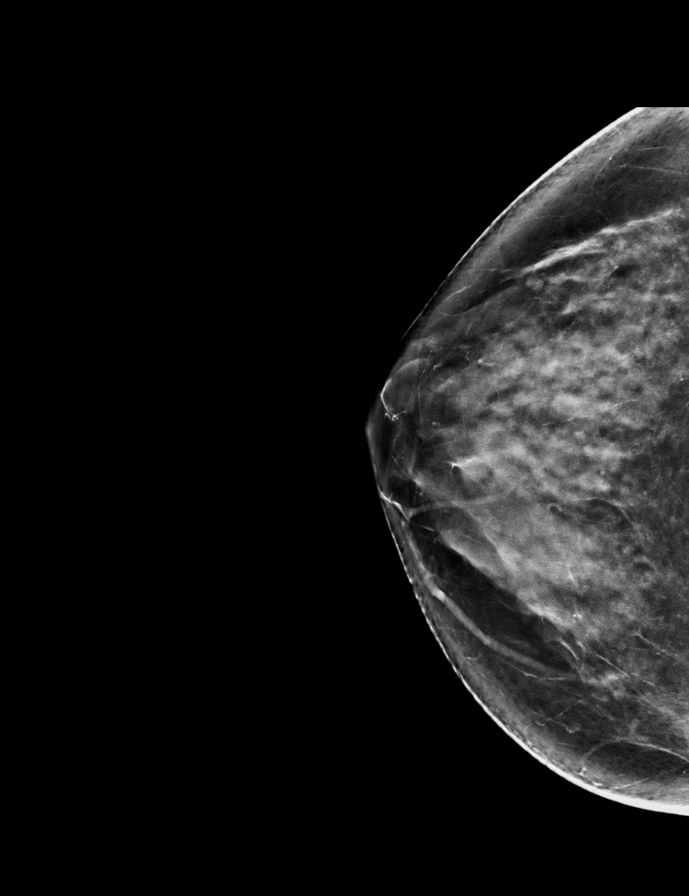

[L CC tomo · tomo slice 40/79.0]
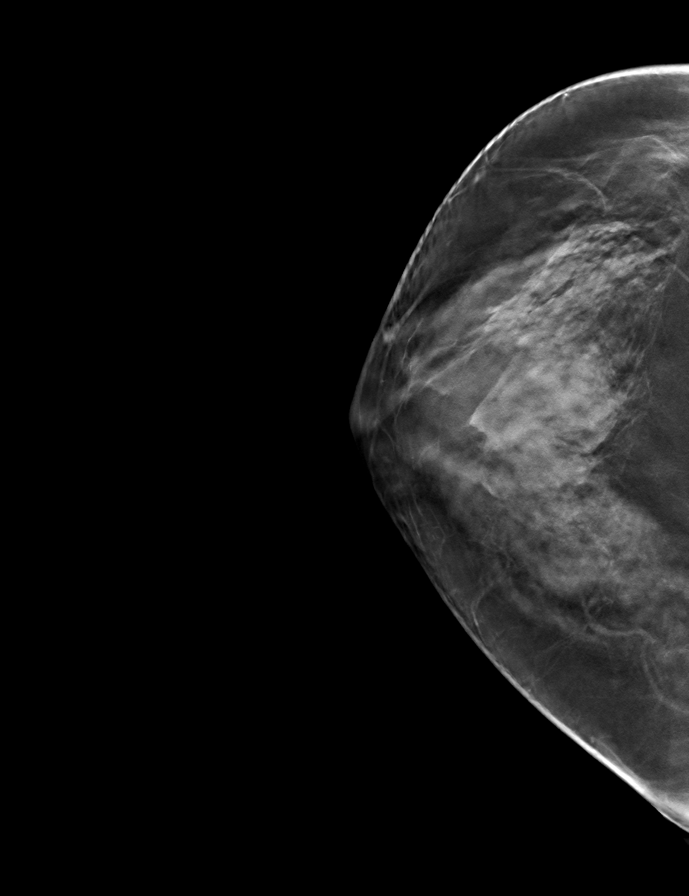

[9 of 28 positions shown; findings below may reference images not displayed]

ACR Breast Density Category c: The breast tissue is heterogeneously
dense, which may obscure small masses.
FINDINGS: In the right breast, a possible mass warrants further evaluation. In
the left breast, no findings suspicious for malignancy. Images were
processed with CAD.
IMPRESSION: Further evaluation is suggested for possible mass in the right
breast.

RECOMMENDATION:
Diagnostic mammogram and possibly ultrasound of the right breast.
(Code:CH-I-66A)

The patient will be contacted regarding the findings, and additional
imaging will be scheduled.

BI-RADS CATEGORY  0: Incomplete. Need additional imaging evaluation
and/or prior mammograms for comparison.

## 2016-11-09 ENCOUNTER — Telehealth: Payer: Self-pay | Admitting: Internal Medicine

## 2016-11-09 DIAGNOSIS — Z0000 Encounter for general adult medical examination without abnormal findings: Secondary | ICD-10-CM

## 2016-11-09 NOTE — Telephone Encounter (Signed)
Pt had an cpe scheduled w/ PCP on Wednesday 11/11/16. Provider will be out of the office the morning of. Pt would still like to come in on 11/11/16 for labs and then come back on 11/13/16 at 2:00 for CPE.    Kaylyn, please place lab orders.   Is it okay to put two slots together to reschedule pt's CPE? She said that she travels for work and made her work schedule around her CPE scheduled this week. This week is the only week that she is in town to complete.   Please advise?

## 2016-11-09 NOTE — Telephone Encounter (Signed)
Please get a CMP, FLP, CBC

## 2016-11-09 NOTE — Telephone Encounter (Signed)
Please advise 

## 2016-11-09 NOTE — Telephone Encounter (Signed)
Labs ordered. Okay to keep appt for 11/13/2016 as scheduled.

## 2016-11-11 ENCOUNTER — Encounter: Payer: BLUE CROSS/BLUE SHIELD | Admitting: Internal Medicine

## 2016-11-11 ENCOUNTER — Other Ambulatory Visit (INDEPENDENT_AMBULATORY_CARE_PROVIDER_SITE_OTHER): Payer: BLUE CROSS/BLUE SHIELD

## 2016-11-11 DIAGNOSIS — Z0000 Encounter for general adult medical examination without abnormal findings: Secondary | ICD-10-CM | POA: Diagnosis not present

## 2016-11-11 LAB — LIPID PANEL
Cholesterol: 213 mg/dL — ABNORMAL HIGH (ref 0–200)
HDL: 75.7 mg/dL (ref >39.00)
LDL Cholesterol: 126 mg/dL — ABNORMAL HIGH (ref 0–99)
NonHDL: 137.07
Total CHOL/HDL Ratio: 3
Triglycerides: 55 mg/dL (ref 0.0–149.0)
VLDL: 11 mg/dL (ref 0.0–40.0)

## 2016-11-11 LAB — CBC WITH DIFFERENTIAL/PLATELET
Basophils Absolute: 0 10*3/uL (ref 0.0–0.1)
Basophils Relative: 1 % (ref 0.0–3.0)
Eosinophils Absolute: 0.1 10*3/uL (ref 0.0–0.7)
Eosinophils Relative: 2.7 % (ref 0.0–5.0)
HCT: 41 % (ref 36.0–46.0)
Hemoglobin: 13.7 g/dL (ref 12.0–15.0)
Lymphocytes Relative: 42.1 % (ref 12.0–46.0)
Lymphs Abs: 1.6 10*3/uL (ref 0.7–4.0)
MCHC: 33.4 g/dL (ref 30.0–36.0)
MCV: 92 fl (ref 78.0–100.0)
Monocytes Absolute: 0.2 10*3/uL (ref 0.1–1.0)
Monocytes Relative: 6.3 % (ref 3.0–12.0)
Neutro Abs: 1.8 10*3/uL (ref 1.4–7.7)
Neutrophils Relative %: 47.9 % (ref 43.0–77.0)
Platelets: 203 10*3/uL (ref 150.0–400.0)
RBC: 4.46 Mil/uL (ref 3.87–5.11)
RDW: 13.1 % (ref 11.5–15.5)
WBC: 3.7 10*3/uL — ABNORMAL LOW (ref 4.0–10.5)

## 2016-11-11 LAB — COMPREHENSIVE METABOLIC PANEL
ALT: 20 U/L (ref 0–35)
AST: 23 U/L (ref 0–37)
Albumin: 4.4 g/dL (ref 3.5–5.2)
Alkaline Phosphatase: 53 U/L (ref 39–117)
BUN: 15 mg/dL (ref 6–23)
CO2: 31 mEq/L (ref 19–32)
Calcium: 9.8 mg/dL (ref 8.4–10.5)
Chloride: 106 mEq/L (ref 96–112)
Creatinine, Ser: 0.73 mg/dL (ref 0.40–1.20)
GFR: 87.83 mL/min (ref >60.00)
Glucose, Bld: 95 mg/dL (ref 70–99)
Potassium: 4.8 mEq/L (ref 3.5–5.1)
Sodium: 142 mEq/L (ref 135–145)
Total Bilirubin: 0.7 mg/dL (ref 0.2–1.2)
Total Protein: 7 g/dL (ref 6.0–8.3)

## 2016-11-13 ENCOUNTER — Encounter: Payer: Self-pay | Admitting: Internal Medicine

## 2016-11-13 ENCOUNTER — Ambulatory Visit (INDEPENDENT_AMBULATORY_CARE_PROVIDER_SITE_OTHER): Payer: BLUE CROSS/BLUE SHIELD | Admitting: Internal Medicine

## 2016-11-13 VITALS — BP 128/70 | HR 62 | Temp 98.0°F | Resp 14 | Ht 64.0 in | Wt 137.5 lb

## 2016-11-13 DIAGNOSIS — Z0000 Encounter for general adult medical examination without abnormal findings: Secondary | ICD-10-CM | POA: Diagnosis not present

## 2016-11-13 MED ORDER — RAMIPRIL 2.5 MG PO CAPS: 2.5000 mg | ORAL_CAPSULE | Freq: Every day | ORAL | 3 refills | Status: DC

## 2016-11-13 NOTE — Progress Notes (Signed)
Subjective:    Patient ID: [name] [name], female    DOB: 03-31-1960, 56 y.o.   MRN: [id number]  DOS:  11/13/2016 Type of visit - description : cpx Interval history: No major concerns Since last year she started to count calories and she is doing great.  Wt Readings from Last 3 Encounters:  11/13/16 137 lb 8 oz (62.4 kg)  11/07/15 152 lb 8 oz (69.2 kg)  12/21/14 150 lb (68 kg)     Review of Systems  A 14 point review of systems is negative    Past Medical History:  Diagnosis Date  . C2 cervical fracture ([place]) 1986   no surgery, had a halo  . HTN (hypertension)   . MVP (mitral valve prolapse)   . Normal cardiac stress test 04/05/2008   Negative  . Skin cancer, basal cell    sees derm q 6 months     Past Surgical History:  Procedure Laterality Date  . DILATION AND CURETTAGE OF UTERUS  2009  . PALATAL OSTEOTOMY    . POLYPECTOMY     Uterus    Social History   Social History  . Marital status: Divorced    Spouse name: N/A  . Number of children: 0  . Years of education: N/A   Occupational History  . 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 Smoking status: Never Smoker  . Smokeless tobacco: Never Used  . Alcohol use 2.4 oz/week    4 Glasses of wine per week     Comment: socially  . Drug use: Unknown  . Sexual activity: Not on file   Other Topics Concern  . Not on file   Social History Narrative   Single, lives w/ b-friend      Allergies as of 11/13/2016      Reactions   Penicillins    ? Reaction @ age 89   Spironolactone    REACTION: blurred vision   Sulfonamide Derivatives    ? Reaction @ age4      Medication List       Accurate as of 11/13/16 11:59 PM. Always use your most recent med list.          fish oil-omega-3 fatty acids 1000 MG capsule Take 1 g by mouth daily.   LYSINE PO Take 1 each by mouth daily.   multivitamin,tx-minerals tablet Take 1 tablet by mouth daily.   ramipril 2.5 MG  capsule Commonly known as:  ALTACE Take 1 capsule (2.5 mg total) by mouth daily.          Objective:   Physical Exam BP 128/70 (BP Location: Left Arm, Patient Position: Sitting, Cuff Size: Small)   Pulse 62   Temp 98 F (36.7 C) (Oral)   Resp 14   Ht 5\' 4"  (1.626 m)   Wt 137 lb 8 oz (62.4 kg)   SpO2 98%   BMI 23.60 kg/m  General:   Well developed, well nourished . NAD.  Neck: No  thyromegaly  HEENT:  Normocephalic . Face symmetric, atraumatic Lungs:  CTA B Normal respiratory effort, no intercostal retractions, no accessory muscle use. Heart: RRR,  no murmur.  No pretibial edema bilaterally  Abdomen:  Not distended, soft, non-tender. No rebound or rigidity.   Skin: Exposed areas without rash. Not pale. Not jaundice Neurologic:  alert & oriented X3.  Speech normal, gait appropriate for age and unassisted Strength symmetric and appropriate for age.  Psych: Cognition and judgment appear intact.  Cooperative with normal attention span and concentration.  Behavior appropriate. No anxious or depressed appearing.     Assessment & Plan:   Assessment> HTN MVP BCC skin cancer-- derm q 6 months C2 cervical Fx 1986, no surgery, had a halo Normal stress test 2010  PLAN: HTN: On Altace, seems to be doing great.  Recent BMP satisfactory ASA: On aspirin, wonders if she could stop.  That is okay, no history of diabetes, cholesterol or heart disease. RTC 1 year

## 2016-11-13 NOTE — Patient Instructions (Signed)
  GO TO THE FRONT DESK Schedule your next appointment for a  Physical exam in 1 year

## 2016-11-13 NOTE — Assessment & Plan Note (Addendum)
-  Td  10-12-2007;  had a flu shot ; shingrix discussed  -Female care   Menopausal ~ 2015- rec   Ca and vit D sw gyn Dr Orvan Seen, had a MMG this year -CCS: cscope (-) except for hemorrhoids 09-2011, next per Dr Sharlett Iles -Diet and exercise: Doing great, counting calories, has lost a significant amount of weight, BMI is healthy. -All recent labs discussed

## 2016-11-13 NOTE — Progress Notes (Signed)
Pre visit review using our clinic review tool, if applicable. No additional management support is needed unless otherwise documented below in the visit note. 

## 2016-11-15 NOTE — Assessment & Plan Note (Signed)
HTN: On Altace, seems to be doing great.  Recent BMP satisfactory ASA: On aspirin, wonders if she could stop.  That is okay, no history of diabetes, cholesterol or heart disease. RTC 1 year

## 2017-01-08 DIAGNOSIS — D2272 Melanocytic nevi of left lower limb, including hip: Secondary | ICD-10-CM | POA: Diagnosis not present

## 2017-01-08 DIAGNOSIS — D225 Melanocytic nevi of trunk: Secondary | ICD-10-CM | POA: Diagnosis not present

## 2017-01-08 DIAGNOSIS — L820 Inflamed seborrheic keratosis: Secondary | ICD-10-CM | POA: Diagnosis not present

## 2017-01-08 DIAGNOSIS — L570 Actinic keratosis: Secondary | ICD-10-CM | POA: Diagnosis not present

## 2017-01-08 DIAGNOSIS — D2271 Melanocytic nevi of right lower limb, including hip: Secondary | ICD-10-CM | POA: Diagnosis not present

## 2017-01-08 DIAGNOSIS — Z85828 Personal history of other malignant neoplasm of skin: Secondary | ICD-10-CM | POA: Diagnosis not present

## 2017-01-08 DIAGNOSIS — L430 Hypertrophic lichen planus: Secondary | ICD-10-CM | POA: Diagnosis not present

## 2017-01-21 ENCOUNTER — Telehealth: Payer: Self-pay | Admitting: *Deleted

## 2017-01-21 NOTE — Telephone Encounter (Signed)
Received Dermatopathology Report results from [facility]; forwarded to provider/SLS 01/03

## 2017-02-03 DIAGNOSIS — Z01419 Encounter for gynecological examination (general) (routine) without abnormal findings: Secondary | ICD-10-CM | POA: Diagnosis not present

## 2017-02-03 DIAGNOSIS — Z13820 Encounter for screening for osteoporosis: Secondary | ICD-10-CM | POA: Diagnosis not present

## 2017-02-03 DIAGNOSIS — Z6824 Body mass index (BMI) 24.0-24.9, adult: Secondary | ICD-10-CM | POA: Diagnosis not present

## 2017-02-03 LAB — HM PAP SMEAR

## 2017-02-03 LAB — HM DEXA SCAN

## 2017-03-04 DIAGNOSIS — M8580 Other specified disorders of bone density and structure, unspecified site: Secondary | ICD-10-CM | POA: Diagnosis not present

## 2017-03-04 LAB — VITAMIN D 25 HYDROXY (VIT D DEFICIENCY, FRACTURES): Vit D, 25-Hydroxy: 38

## 2017-03-04 LAB — TSH: TSH: 0.91 (ref 0.41–5.90)

## 2017-07-07 ENCOUNTER — Telehealth: Payer: Self-pay

## 2017-07-07 DIAGNOSIS — Z789 Other specified health status: Secondary | ICD-10-CM

## 2017-07-07 NOTE — Telephone Encounter (Signed)
Scheduled for 07/09/17 @ 2:45.

## 2017-07-07 NOTE — Telephone Encounter (Addendum)
Please schedule nurse visit for TDAP- we will need to check immunity (via blood) to see if MMR is needed.

## 2017-07-07 NOTE — Telephone Encounter (Signed)
Last TD 2009, agree with booster Check MMR titers, booster if needed.

## 2017-07-07 NOTE — Telephone Encounter (Signed)
Copied from [place] [id number]. Topic: General - Other >> Jul 07, 2017  4:03 PM Mcneil, Ja-Kwan wrote: Reason for CRM: Pt states she travels a lot and her employer is requiring that she have the T-dap vaccine. Pt also asked about getting MMR booster. Pt request call back. Cb# [phone]

## 2017-07-07 NOTE — Telephone Encounter (Signed)
Please advise regarding MMR booster?

## 2017-07-09 ENCOUNTER — Other Ambulatory Visit: Payer: Self-pay

## 2017-07-09 ENCOUNTER — Ambulatory Visit (INDEPENDENT_AMBULATORY_CARE_PROVIDER_SITE_OTHER): Payer: BLUE CROSS/BLUE SHIELD

## 2017-07-09 ENCOUNTER — Other Ambulatory Visit (INDEPENDENT_AMBULATORY_CARE_PROVIDER_SITE_OTHER): Payer: BLUE CROSS/BLUE SHIELD

## 2017-07-09 DIAGNOSIS — Z85828 Personal history of other malignant neoplasm of skin: Secondary | ICD-10-CM | POA: Diagnosis not present

## 2017-07-09 DIAGNOSIS — D2272 Melanocytic nevi of left lower limb, including hip: Secondary | ICD-10-CM | POA: Diagnosis not present

## 2017-07-09 DIAGNOSIS — Z23 Encounter for immunization: Secondary | ICD-10-CM | POA: Diagnosis not present

## 2017-07-09 DIAGNOSIS — Z789 Other specified health status: Secondary | ICD-10-CM

## 2017-07-09 DIAGNOSIS — D2271 Melanocytic nevi of right lower limb, including hip: Secondary | ICD-10-CM | POA: Diagnosis not present

## 2017-07-09 DIAGNOSIS — L821 Other seborrheic keratosis: Secondary | ICD-10-CM | POA: Diagnosis not present

## 2017-07-09 NOTE — Progress Notes (Signed)
Pre visit review using our clinic review tool, if applicable. No additional management support is needed unless otherwise documented below in the visit note.  Pt here today for TDAP and MMR titers. 0.65m injected into L deltoid. Pt tolerated injection well.   Pt informed that we will let her know of lab results when they come back as to whether MMR vaccine needs to be completed. Pt verbalized understanding.

## 2017-07-09 NOTE — Addendum Note (Signed)
Addended byDamita Dunnings D on: 07/09/2017 09:05 AM   Modules accepted: Orders

## 2017-07-09 NOTE — Progress Notes (Signed)
Reviewed  Yvonne R Lowne Chase, DO  

## 2017-07-12 LAB — MEASLES/MUMPS/RUBELLA IMMUNITY
Mumps IgG: 42.8 AU/mL
Rubella: 23.4 index
Rubeola IgG: <25 AU/mL — ABNORMAL LOW

## 2017-07-15 ENCOUNTER — Other Ambulatory Visit: Payer: Self-pay | Admitting: Obstetrics and Gynecology

## 2017-07-15 DIAGNOSIS — Z1231 Encounter for screening mammogram for malignant neoplasm of breast: Secondary | ICD-10-CM

## 2017-07-16 ENCOUNTER — Ambulatory Visit (INDEPENDENT_AMBULATORY_CARE_PROVIDER_SITE_OTHER): Payer: BLUE CROSS/BLUE SHIELD

## 2017-07-16 DIAGNOSIS — Z23 Encounter for immunization: Secondary | ICD-10-CM

## 2017-07-16 NOTE — Progress Notes (Signed)
Pre visit review using our clinic review tool, if applicable. No additional management support is needed unless otherwise documented below in the visit note.  Patient here for MMR booster per PCP.   Patient given MMR vaccine in left upper arm SQ. Pt tolerated well. VIS given.

## 2017-08-03 ENCOUNTER — Ambulatory Visit
Admission: RE | Admit: 2017-08-03 | Discharge: 2017-08-03 | Disposition: A | Discharge status: Home / Self Care | Payer: BLUE CROSS/BLUE SHIELD | Source: Ambulatory Visit | Attending: Obstetrics and Gynecology | Admitting: Obstetrics and Gynecology

## 2017-08-03 DIAGNOSIS — Z1231 Encounter for screening mammogram for malignant neoplasm of breast: Secondary | ICD-10-CM | POA: Diagnosis not present

## 2017-08-03 LAB — HM MAMMOGRAPHY

## 2017-08-20 ENCOUNTER — Encounter: Payer: Self-pay | Admitting: Internal Medicine

## 2017-11-16 ENCOUNTER — Other Ambulatory Visit: Payer: Self-pay | Admitting: Internal Medicine

## 2017-11-18 ENCOUNTER — Ambulatory Visit (INDEPENDENT_AMBULATORY_CARE_PROVIDER_SITE_OTHER): Payer: BLUE CROSS/BLUE SHIELD | Admitting: Internal Medicine

## 2017-11-18 ENCOUNTER — Encounter: Payer: Self-pay | Admitting: Internal Medicine

## 2017-11-18 VITALS — BP 124/68 | HR 73 | Temp 98.4°F | Resp 16 | Ht 64.0 in | Wt 141.2 lb

## 2017-11-18 DIAGNOSIS — Z23 Encounter for immunization: Secondary | ICD-10-CM | POA: Diagnosis not present

## 2017-11-18 DIAGNOSIS — M8588 Other specified disorders of bone density and structure, other site: Secondary | ICD-10-CM | POA: Diagnosis not present

## 2017-11-18 DIAGNOSIS — Z0000 Encounter for general adult medical examination without abnormal findings: Secondary | ICD-10-CM | POA: Diagnosis not present

## 2017-11-18 DIAGNOSIS — M8580 Other specified disorders of bone density and structure, unspecified site: Secondary | ICD-10-CM | POA: Insufficient documentation

## 2017-11-18 LAB — BASIC METABOLIC PANEL
BUN: 12 mg/dL (ref 6–23)
CO2: 32 mEq/L (ref 19–32)
Calcium: 9.7 mg/dL (ref 8.4–10.5)
Chloride: 103 mEq/L (ref 96–112)
Creatinine, Ser: 0.72 mg/dL (ref 0.40–1.20)
GFR: 88.91 mL/min (ref >60.00)
Glucose, Bld: 91 mg/dL (ref 70–99)
Potassium: 4.1 mEq/L (ref 3.5–5.1)
Sodium: 141 mEq/L (ref 135–145)

## 2017-11-18 LAB — LIPID PANEL
Cholesterol: 221 mg/dL — ABNORMAL HIGH (ref 0–200)
HDL: 86.8 mg/dL (ref >39.00)
LDL Cholesterol: 124 mg/dL — ABNORMAL HIGH (ref 0–99)
NonHDL: 134.49
Total CHOL/HDL Ratio: 3
Triglycerides: 54 mg/dL (ref 0.0–149.0)
VLDL: 10.8 mg/dL (ref 0.0–40.0)

## 2017-11-18 LAB — TSH: TSH: 1.38 u[IU]/mL (ref 0.35–4.50)

## 2017-11-18 NOTE — Assessment & Plan Note (Signed)
-  Td  10-12-2007;   shingrix discussed 2018; flu shot today Had a MMR booster July 2019 -Female care   Menopausal ~ 2015-  seesDr Orvan Seen, had a MMG  08/2017 -CCS: cscope (-) except for hemorrhoids 09-2011, next per Dr Sharlett Iles -Diet and exercise: Doing great  -labs: bmp,flp,tsh

## 2017-11-18 NOTE — Progress Notes (Signed)
Pre visit review using our clinic review tool, if applicable. No additional management support is needed unless otherwise documented below in the visit note. 

## 2017-11-18 NOTE — Progress Notes (Signed)
Subjective:    Patient ID: [name] [name], female    DOB: September 01, 1960, 57 y.o.   MRN: [id number]  DOS:  11/18/2017 Type of visit - description : cpx Interval history:  Doing well  Review of Systems  A 14 point review of systems is negative   Past Medical History:  Diagnosis Date  . C2 cervical fracture ([place]) 1986   no surgery, had a halo  . HTN (hypertension)   . MVP (mitral valve prolapse)   . Normal cardiac stress test 04/05/2008   Negative  . Osteopenia   . Skin cancer, basal cell    sees derm q 6 months     Past Surgical History:  Procedure Laterality Date  . DILATION AND CURETTAGE OF UTERUS  2009  . PALATAL OSTEOTOMY    . POLYPECTOMY     Uterus    Social History   Socioeconomic History  . Marital status: Divorced    Spouse name: Not on file  . Number of children: 0  . Years of education: Not on file  . Highest education level: Not on file  Occupational History  . Occupation:  Mudlogger for Artist: [employer]  Social Needs  . Financial resource strain: Not on file  . Food insecurity:    Worry: Not on file    Inability: Not on file  . Transportation needs:    Medical: Not on file    Non-medical: Not on file  Tobacco Use  . Smoking status: Never Smoker  . Smokeless tobacco: Never Used  Substance and Sexual Activity  . Alcohol use: Yes    Alcohol/week: 4.0 standard drinks    Types: 4 Glasses of wine per week    Comment: socially  . Drug use: Not on file  . Sexual activity: Not on file  Lifestyle  . Physical activity:    Days per week: Not on file    Minutes per session: Not on file  . Stress: Not on file  Relationships  . Social connections:    Talks on phone: Not on file    Gets together: Not on file    Attends religious service: Not on file    Active member of club or organization: Not on file    Attends meetings of clubs or organizations: Not on file    Relationship status: Not on file  . Intimate partner violence:    Fear  of current or ex partner: Not on file    Emotionally abused: Not on file    Physically abused: Not on file    Forced sexual activity: Not on file  Other Topics Concern  . Not on file  Social History Narrative   Single, lives w/ b-friend     Family History  Problem Relation Age of Onset  . Cervical cancer Sister        [2] sisters  . Breast cancer Maternal Grandmother 69  . COPD Father   . Hypertension Mother   . Seizures Brother   . Coronary artery disease Neg Hx   . Diabetes Neg Hx   . Colon cancer Neg Hx      Allergies as of 11/18/2017      Reactions   Penicillins    ? Reaction @ age 48   Spironolactone    REACTION: blurred vision   Sulfonamide Derivatives    ? Reaction @ age4      Medication List  Accurate as of 11/18/17 11:59 PM. Always use your most recent med list.          fish oil-omega-3 fatty acids 1000 MG capsule Take 1 g by mouth daily.   LYSINE PO Take 1 each by mouth daily.   multivitamin,tx-minerals tablet Take 1 tablet by mouth daily.   ramipril 2.5 MG capsule Commonly known as:  ALTACE Take 1 capsule (2.5 mg total) by mouth daily.   VITAMIN D-3 PO          Objective:   Physical Exam BP 124/68 (BP Location: Left Arm, Patient Position: Sitting, Cuff Size: Small)   Pulse 73   Temp 98.4 F (36.9 C) (Oral)   Resp 16   Ht 5\' 4"  (1.626 m)   Wt 141 lb 4 oz (64.1 kg)   SpO2 94%   BMI 24.25 kg/m  General: Well developed, NAD, see BMI.  Neck: No  thyromegaly  HEENT:  Normocephalic . Face symmetric, atraumatic Lungs:  CTA B Normal respiratory effort, no intercostal retractions, no accessory muscle use. Heart: RRR,  no murmur.  No pretibial edema bilaterally  Abdomen:  Not distended, soft, non-tender. No rebound or rigidity.   Skin: Exposed areas without rash. Not pale. Not jaundice Neurologic:  alert & oriented X3.  Speech normal, gait appropriate for age and unassisted Strength symmetric and appropriate for age.    Psych: Cognition and judgment appear intact.  Cooperative with normal attention span and concentration.  Behavior appropriate. No anxious or depressed appearing.     Assessment & Plan:   Assessment> HTN MVP BCC skin cancer-- derm q 6 months C2 cervical Fx 1986, no surgery, had a halo Normal stress test 2010 Osteopenia, T score -1.7 @ gyn  (01-2017)  PLAN: HTN: On Altace,  controlled RTC 1 year

## 2017-11-18 NOTE — Patient Instructions (Signed)
GO TO THE LAB : Get the blood work     GO TO THE FRONT DESK Schedule your next appointment for a  Physical exam in 1 year 

## 2017-11-21 NOTE — Assessment & Plan Note (Signed)
HTN: On Altace,  controlled RTC 1 year

## 2018-01-28 ENCOUNTER — Other Ambulatory Visit: Payer: Self-pay | Admitting: Internal Medicine

## 2018-01-28 MED ORDER — RAMIPRIL 2.5 MG PO CAPS: 2.5000 mg | ORAL_CAPSULE | Freq: Every day | ORAL | 3 refills | Status: DC

## 2018-01-31 ENCOUNTER — Encounter: Payer: Self-pay | Admitting: Internal Medicine

## 2018-01-31 DIAGNOSIS — D225 Melanocytic nevi of trunk: Secondary | ICD-10-CM | POA: Diagnosis not present

## 2018-01-31 DIAGNOSIS — L853 Xerosis cutis: Secondary | ICD-10-CM | POA: Diagnosis not present

## 2018-01-31 DIAGNOSIS — D0471 Carcinoma in situ of skin of right lower limb, including hip: Secondary | ICD-10-CM | POA: Diagnosis not present

## 2018-01-31 DIAGNOSIS — L821 Other seborrheic keratosis: Secondary | ICD-10-CM | POA: Diagnosis not present

## 2018-01-31 DIAGNOSIS — L570 Actinic keratosis: Secondary | ICD-10-CM | POA: Diagnosis not present

## 2018-01-31 DIAGNOSIS — Z85828 Personal history of other malignant neoplasm of skin: Secondary | ICD-10-CM | POA: Diagnosis not present

## 2018-02-08 DIAGNOSIS — Z01419 Encounter for gynecological examination (general) (routine) without abnormal findings: Secondary | ICD-10-CM | POA: Diagnosis not present

## 2018-02-08 DIAGNOSIS — Z6824 Body mass index (BMI) 24.0-24.9, adult: Secondary | ICD-10-CM | POA: Diagnosis not present

## 2018-08-08 ENCOUNTER — Other Ambulatory Visit: Payer: Self-pay | Admitting: Obstetrics and Gynecology

## 2018-08-08 DIAGNOSIS — Z1231 Encounter for screening mammogram for malignant neoplasm of breast: Secondary | ICD-10-CM

## 2018-09-21 ENCOUNTER — Other Ambulatory Visit: Payer: Self-pay

## 2018-09-21 ENCOUNTER — Ambulatory Visit
Admission: RE | Admit: 2018-09-21 | Discharge: 2018-09-21 | Disposition: A | Discharge status: Home / Self Care | Payer: BC Managed Care – PPO | Source: Ambulatory Visit | Attending: Obstetrics and Gynecology | Admitting: Obstetrics and Gynecology

## 2018-09-21 DIAGNOSIS — Z1231 Encounter for screening mammogram for malignant neoplasm of breast: Secondary | ICD-10-CM | POA: Diagnosis not present

## 2018-10-05 DIAGNOSIS — L821 Other seborrheic keratosis: Secondary | ICD-10-CM | POA: Diagnosis not present

## 2018-10-05 DIAGNOSIS — L570 Actinic keratosis: Secondary | ICD-10-CM | POA: Diagnosis not present

## 2018-10-05 DIAGNOSIS — D225 Melanocytic nevi of trunk: Secondary | ICD-10-CM | POA: Diagnosis not present

## 2018-10-05 DIAGNOSIS — Z85828 Personal history of other malignant neoplasm of skin: Secondary | ICD-10-CM | POA: Diagnosis not present

## 2018-10-05 DIAGNOSIS — L812 Freckles: Secondary | ICD-10-CM | POA: Diagnosis not present

## 2018-11-24 ENCOUNTER — Other Ambulatory Visit: Payer: Self-pay

## 2018-11-24 ENCOUNTER — Encounter: Payer: Self-pay | Admitting: Internal Medicine

## 2018-11-24 ENCOUNTER — Ambulatory Visit (INDEPENDENT_AMBULATORY_CARE_PROVIDER_SITE_OTHER): Payer: BC Managed Care – PPO | Admitting: Internal Medicine

## 2018-11-24 VITALS — BP 143/87 | HR 70 | Temp 96.5°F | Resp 16 | Ht 64.0 in | Wt 136.4 lb

## 2018-11-24 DIAGNOSIS — I10 Essential (primary) hypertension: Secondary | ICD-10-CM

## 2018-11-24 DIAGNOSIS — Z0000 Encounter for general adult medical examination without abnormal findings: Secondary | ICD-10-CM | POA: Diagnosis not present

## 2018-11-24 LAB — COMPREHENSIVE METABOLIC PANEL
ALT: 25 U/L (ref 0–35)
AST: 25 U/L (ref 0–37)
Albumin: 4.6 g/dL (ref 3.5–5.2)
Alkaline Phosphatase: 70 U/L (ref 39–117)
BUN: 14 mg/dL (ref 6–23)
CO2: 29 mEq/L (ref 19–32)
Calcium: 9.8 mg/dL (ref 8.4–10.5)
Chloride: 103 mEq/L (ref 96–112)
Creatinine, Ser: 0.65 mg/dL (ref 0.40–1.20)
GFR: 93.79 mL/min (ref >60.00)
Glucose, Bld: 91 mg/dL (ref 70–99)
Potassium: 4.3 mEq/L (ref 3.5–5.1)
Sodium: 140 mEq/L (ref 135–145)
Total Bilirubin: 0.7 mg/dL (ref 0.2–1.2)
Total Protein: 6.9 g/dL (ref 6.0–8.3)

## 2018-11-24 LAB — LIPID PANEL
Cholesterol: 246 mg/dL — ABNORMAL HIGH (ref 0–200)
HDL: 92.9 mg/dL (ref >39.00)
LDL Cholesterol: 143 mg/dL — ABNORMAL HIGH (ref 0–99)
NonHDL: 152.74
Total CHOL/HDL Ratio: 3
Triglycerides: 48 mg/dL (ref 0.0–149.0)
VLDL: 9.6 mg/dL (ref 0.0–40.0)

## 2018-11-24 LAB — CBC WITH DIFFERENTIAL/PLATELET
Basophils Absolute: 0 10*3/uL (ref 0.0–0.1)
Basophils Relative: 1 % (ref 0.0–3.0)
Eosinophils Absolute: 0.1 10*3/uL (ref 0.0–0.7)
Eosinophils Relative: 2 % (ref 0.0–5.0)
HCT: 41.4 % (ref 36.0–46.0)
Hemoglobin: 14 g/dL (ref 12.0–15.0)
Lymphocytes Relative: 42.3 % (ref 12.0–46.0)
Lymphs Abs: 1.5 10*3/uL (ref 0.7–4.0)
MCHC: 33.8 g/dL (ref 30.0–36.0)
MCV: 91 fl (ref 78.0–100.0)
Monocytes Absolute: 0.3 10*3/uL (ref 0.1–1.0)
Monocytes Relative: 7.9 % (ref 3.0–12.0)
Neutro Abs: 1.6 10*3/uL (ref 1.4–7.7)
Neutrophils Relative %: 46.8 % (ref 43.0–77.0)
Platelets: 216 10*3/uL (ref 150.0–400.0)
RBC: 4.55 Mil/uL (ref 3.87–5.11)
RDW: 12.7 % (ref 11.5–15.5)
WBC: 3.5 10*3/uL — ABNORMAL LOW (ref 4.0–10.5)

## 2018-11-24 NOTE — Patient Instructions (Addendum)
GO TO THE LAB : Get the blood work     GO TO THE FRONT DESK Schedule your next appointment   for a physical exam in 1 year    Check the  blood pressure  weekly   BP GOAL is between 110/65 and  135/85. If it is consistently higher or lower, let me know   HOW TO TAKE YOUR BLOOD PRESSURE:   Rest 5 minutes before taking your blood pressure.   Don't smoke or drink caffeinated beverages for at least 30 minutes before.   Take your blood pressure before (not after) you eat.   Sit comfortably with your back supported and both feet on the floor (don't cross your legs).   Elevate your arm to heart level on a table or a desk.   Use the proper sized cuff. It should fit smoothly and snugly around your bare upper arm. There should be enough room to slip a fingertip under the cuff. The bottom edge of the cuff should be 1 inch above the crease of the elbow.   Ideally, take 3 measurements at one sitting and record the average.

## 2018-11-24 NOTE — Progress Notes (Signed)
Pre visit review using our clinic review tool, if applicable. No additional management support is needed unless otherwise documented below in the visit note. 

## 2018-11-24 NOTE — Progress Notes (Signed)
Subjective:    Patient ID: [name] [name], female    DOB: 1960/10/16, 58 y.o.   MRN: [id number]  DOS:  11/24/2018 Type of visit - description: CPX Since the last office visit he is doing well, admits to a lot of stress due to a number of factors including his job, they have a new computer system. Fortunately she is managing well, sleeping okay.  Review of Systems  Other than above, a 14 point review of systems is negative    Past Medical History:  Diagnosis Date  . C2 cervical fracture ([place]) 1986   no surgery, had a halo  . HTN (hypertension)   . MVP (mitral valve prolapse)   . Normal cardiac stress test 04/05/2008   Negative  . Osteopenia   . Skin cancer, basal cell    sees derm q 6 months     Past Surgical History:  Procedure Laterality Date  . DILATION AND CURETTAGE OF UTERUS  2009  . PALATAL OSTEOTOMY    . POLYPECTOMY     Uterus    Social History   Socioeconomic History  . Marital status: Divorced    Spouse name: Not on file  . Number of children: 0  . Years of education: Not on file  . Highest education level: Not on file  Occupational History  . Occupation:  Mudlogger for [employer]  . 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 Financial resource strain: Not on file  . Food insecurity    Worry: Not on file    Inability: Not on file  . Transportation needs    Medical: Not on file    Non-medical: Not on file  Tobacco Use  . Smoking status: Never Smoker  . Smokeless tobacco: Never Used  Substance and Sexual Activity  . Alcohol use: Yes    Alcohol/week: 4.0 standard drinks    Types: 4 Glasses of wine per week    Comment: socially  . Drug use: Not on file  . Sexual activity: Not on file  Lifestyle  . Physical activity    Days per week: Not on file    Minutes per session: Not on file  . Stress: Not on file  Relationships  . Social Herbalist on phone: Not on file    Gets together: Not on file    Attends religious service: Not on  file    Active member of club or organization: Not on file    Attends meetings of clubs or organizations: Not on file    Relationship status: Not on file  . Intimate partner violence    Fear of current or ex partner: Not on file    Emotionally abused: Not on file    Physically abused: Not on file    Forced sexual activity: Not on file  Other Topics Concern  . Not on file  Social History Narrative   Single, lives w/ b-friend     Family History  Problem Relation Age of Onset  . Cervical cancer Sister        [2] sisters  . Breast cancer Maternal Grandmother 70  . COPD Father   . Hypertension Mother   . Seizures Brother   . Coronary artery disease Neg Hx   . Diabetes Neg Hx   . Colon cancer Neg Hx      Allergies as of 11/24/2018      Reactions   Penicillins    ? Reaction @ age 49  Spironolactone    REACTION: blurred vision   Sulfonamide Derivatives    ? Reaction @ age4      Medication List       Accurate as of November 24, 2018 11:59 PM. If you have any questions, ask your nurse or doctor.        fish oil-omega-3 fatty acids 1000 MG capsule Take 1 g by mouth daily.   LYSINE PO Take 1 each by mouth daily.   multivitamin,tx-minerals tablet Take 1 tablet by mouth daily.   ramipril 2.5 MG capsule Commonly known as: ALTACE Take 1 capsule (2.5 mg total) by mouth daily.   VITAMIN D-3 PO           Objective:   Physical Exam BP (!) 143/87 (BP Location: Left Arm, Patient Position: Sitting, Cuff Size: Small)   Pulse 70   Temp (!) 96.5 F (35.8 C) (Temporal)   Resp 16   Ht 5\' 4"  (1.626 m)   Wt 136 lb 6 oz (61.9 kg)   SpO2 100%   BMI 23.41 kg/m  General: Well developed, NAD, BMI noted Neck: No  thyromegaly  HEENT:  Normocephalic . Face symmetric, atraumatic Lungs:  CTA B Normal respiratory effort, no intercostal retractions, no accessory muscle use. Heart: RRR,  no murmur.  No pretibial edema bilaterally  Abdomen:  Not distended, soft, non-tender.  No rebound or rigidity.   Skin: Exposed areas without rash. Not pale. Not jaundice Neurologic:  alert & oriented X3.  Speech normal, gait appropriate for age and unassisted Strength symmetric and appropriate for age.  Psych: Cognition and judgment appear intact.  Cooperative with normal attention span and concentration.  Behavior appropriate. No anxious or depressed appearing.     Assessment     Assessment  HTN MVP BCC skin cancer-- derm q 6 months C2 cervical Fx 1986, no surgery, had a halo Normal stress test 2010 Osteopenia, T score -1.7 @ gyn  (01-2017)  PLAN: Here for physical exam HTN: BP is slightly elevated today, at home is sometimes elevated, typically she rechecks after few minutes and is back to normal.  She is under a lot of stress. No change for now, on Altace, continue to check BPs, [name] AVS EKG: NSR, no acute changes BCC, skin cancer: Sees Derm regularly Under a lot of stress but managing well. RTC 1 year

## 2018-11-24 NOTE — Assessment & Plan Note (Signed)
-  Td  2019 -  shingrix discussed, will wait -  Had a flu shot - Had a MMR booster July 2019 -Female care   Menopausal ~ 2015, ees Dr Julien Girt, had a MMG    -CCS: cscope (-) except for hemorrhoids 09-2011, next per Dr Sharlett Iles -Diet and exercise: Doing well, remains active taking walks 5 or 6 times a week.  Eating some extra snacks lately.  Counseled. -labs:  CBC, CMP, cholesterol panel

## 2018-11-25 NOTE — Assessment & Plan Note (Signed)
PLAN: Here for physical exam HTN: BP is slightly elevated today, at home is sometimes elevated, typically she rechecks after few minutes and is back to normal.  She is under a lot of stress. No change for now, on Altace, continue to check BPs, see AVS EKG: NSR, no acute changes BCC, skin cancer: Sees Derm regularly Under a lot of stress but managing well. RTC 1 year

## 2018-12-18 ENCOUNTER — Other Ambulatory Visit: Payer: Self-pay | Admitting: Internal Medicine

## 2019-02-07 DIAGNOSIS — N3000 Acute cystitis without hematuria: Secondary | ICD-10-CM | POA: Diagnosis not present

## 2019-03-17 DIAGNOSIS — Z13820 Encounter for screening for osteoporosis: Secondary | ICD-10-CM | POA: Diagnosis not present

## 2019-03-17 DIAGNOSIS — Z01419 Encounter for gynecological examination (general) (routine) without abnormal findings: Secondary | ICD-10-CM | POA: Diagnosis not present

## 2019-03-17 DIAGNOSIS — Z6824 Body mass index (BMI) 24.0-24.9, adult: Secondary | ICD-10-CM | POA: Diagnosis not present

## 2019-04-04 DIAGNOSIS — M8580 Other specified disorders of bone density and structure, unspecified site: Secondary | ICD-10-CM | POA: Diagnosis not present

## 2019-04-12 DIAGNOSIS — Z85828 Personal history of other malignant neoplasm of skin: Secondary | ICD-10-CM | POA: Diagnosis not present

## 2019-04-12 DIAGNOSIS — D2271 Melanocytic nevi of right lower limb, including hip: Secondary | ICD-10-CM | POA: Diagnosis not present

## 2019-04-12 DIAGNOSIS — L570 Actinic keratosis: Secondary | ICD-10-CM | POA: Diagnosis not present

## 2019-04-12 DIAGNOSIS — L821 Other seborrheic keratosis: Secondary | ICD-10-CM | POA: Diagnosis not present

## 2019-04-12 DIAGNOSIS — D2272 Melanocytic nevi of left lower limb, including hip: Secondary | ICD-10-CM | POA: Diagnosis not present

## 2019-04-15 DIAGNOSIS — Z23 Encounter for immunization: Secondary | ICD-10-CM | POA: Diagnosis not present

## 2019-05-13 DIAGNOSIS — Z23 Encounter for immunization: Secondary | ICD-10-CM | POA: Diagnosis not present

## 2019-08-18 ENCOUNTER — Other Ambulatory Visit: Payer: Self-pay | Admitting: Obstetrics and Gynecology

## 2019-08-18 DIAGNOSIS — Z1231 Encounter for screening mammogram for malignant neoplasm of breast: Secondary | ICD-10-CM

## 2019-09-26 ENCOUNTER — Ambulatory Visit
Admission: RE | Admit: 2019-09-26 | Discharge: 2019-09-26 | Disposition: A | Discharge status: Home / Self Care | Payer: BC Managed Care – PPO | Source: Ambulatory Visit | Attending: Obstetrics and Gynecology | Admitting: Obstetrics and Gynecology

## 2019-09-26 ENCOUNTER — Other Ambulatory Visit: Payer: Self-pay

## 2019-09-26 DIAGNOSIS — Z1231 Encounter for screening mammogram for malignant neoplasm of breast: Secondary | ICD-10-CM | POA: Diagnosis not present

## 2019-11-29 ENCOUNTER — Encounter: Payer: BC Managed Care – PPO | Admitting: Internal Medicine

## 2019-12-29 ENCOUNTER — Encounter: Payer: Self-pay | Admitting: Internal Medicine

## 2019-12-29 ENCOUNTER — Ambulatory Visit (INDEPENDENT_AMBULATORY_CARE_PROVIDER_SITE_OTHER): Payer: BC Managed Care – PPO | Admitting: Internal Medicine

## 2019-12-29 ENCOUNTER — Other Ambulatory Visit: Payer: Self-pay

## 2019-12-29 VITALS — BP 136/86 | HR 81 | Temp 98.6°F | Resp 16 | Ht 64.0 in | Wt 139.1 lb

## 2019-12-29 DIAGNOSIS — Z0000 Encounter for general adult medical examination without abnormal findings: Secondary | ICD-10-CM | POA: Diagnosis not present

## 2019-12-29 DIAGNOSIS — I10 Essential (primary) hypertension: Secondary | ICD-10-CM

## 2019-12-29 DIAGNOSIS — Z23 Encounter for immunization: Secondary | ICD-10-CM

## 2019-12-29 LAB — CBC WITH DIFFERENTIAL/PLATELET
Basophils Absolute: 0 10*3/uL (ref 0.0–0.1)
Basophils Relative: 0.6 % (ref 0.0–3.0)
Eosinophils Absolute: 0.1 10*3/uL (ref 0.0–0.7)
Eosinophils Relative: 1.9 % (ref 0.0–5.0)
HCT: 40.5 % (ref 36.0–46.0)
Hemoglobin: 13.7 g/dL (ref 12.0–15.0)
Lymphocytes Relative: 42.6 % (ref 12.0–46.0)
Lymphs Abs: 1.6 10*3/uL (ref 0.7–4.0)
MCHC: 33.8 g/dL (ref 30.0–36.0)
MCV: 88.5 fl (ref 78.0–100.0)
Monocytes Absolute: 0.2 10*3/uL (ref 0.1–1.0)
Monocytes Relative: 6.3 % (ref 3.0–12.0)
Neutro Abs: 1.8 10*3/uL (ref 1.4–7.7)
Neutrophils Relative %: 48.6 % (ref 43.0–77.0)
Platelets: 217 10*3/uL (ref 150.0–400.0)
RBC: 4.58 Mil/uL (ref 3.87–5.11)
RDW: 12.7 % (ref 11.5–15.5)
WBC: 3.7 10*3/uL — ABNORMAL LOW (ref 4.0–10.5)

## 2019-12-29 LAB — COMPREHENSIVE METABOLIC PANEL
ALT: 23 U/L (ref 0–35)
AST: 22 U/L (ref 0–37)
Albumin: 4.4 g/dL (ref 3.5–5.2)
Alkaline Phosphatase: 59 U/L (ref 39–117)
BUN: 12 mg/dL (ref 6–23)
CO2: 30 mEq/L (ref 19–32)
Calcium: 9.2 mg/dL (ref 8.4–10.5)
Chloride: 104 mEq/L (ref 96–112)
Creatinine, Ser: 0.69 mg/dL (ref 0.40–1.20)
GFR: 95.57 mL/min (ref >60.00)
Glucose, Bld: 87 mg/dL (ref 70–99)
Potassium: 4.3 mEq/L (ref 3.5–5.1)
Sodium: 140 mEq/L (ref 135–145)
Total Bilirubin: 0.8 mg/dL (ref 0.2–1.2)
Total Protein: 6.7 g/dL (ref 6.0–8.3)

## 2019-12-29 LAB — LIPID PANEL
Cholesterol: 232 mg/dL — ABNORMAL HIGH (ref 0–200)
HDL: 85.7 mg/dL (ref >39.00)
LDL Cholesterol: 136 mg/dL — ABNORMAL HIGH (ref 0–99)
NonHDL: 146.48
Total CHOL/HDL Ratio: 3
Triglycerides: 52 mg/dL (ref 0.0–149.0)
VLDL: 10.4 mg/dL (ref 0.0–40.0)

## 2019-12-29 LAB — TSH: TSH: 1.01 u[IU]/mL (ref 0.35–4.50)

## 2019-12-29 NOTE — Progress Notes (Signed)
Pre visit review using our clinic review tool, if applicable. No additional management support is needed unless otherwise documented below in the visit note. 

## 2019-12-29 NOTE — Patient Instructions (Signed)
Check the  blood pressure regularly BP GOAL is between 110/65 and  135/85. If it is consistently higher or lower, let me know  GO TO THE LAB : Get the blood work     [place], [place] back for   the shingles vaccine booster in 2 months  Come back for a physical exam in 1 year

## 2019-12-29 NOTE — Progress Notes (Signed)
Subjective:    Patient ID: [name] [name], female    DOB: December 05, 1960, 59 y.o.   MRN: [id number]  DOS:  12/29/2019 Type of visit - description: CPX Doing well.  Has no major concerns.    Review of Systems   A 14 point review of systems is negative    Past Medical History:  Diagnosis Date  . C2 cervical fracture ([place]) 1986   no surgery, had a halo  . HTN (hypertension)   . MVP (mitral valve prolapse)   . Normal cardiac stress test 04/05/2008   Negative  . Osteopenia   . Skin cancer, basal cell    sees derm q 6 months     Past Surgical History:  Procedure Laterality Date  . DILATION AND CURETTAGE OF UTERUS  2009  . PALATAL OSTEOTOMY    . POLYPECTOMY     Uterus    Allergies as of 12/29/2019      Reactions   Penicillins    ? Reaction @ age 6   Spironolactone    REACTION: blurred vision   Sulfonamide Derivatives    ? Reaction @ age4      Medication List       Accurate as of December 29, 2019 11:59 PM. If you have any questions, ask your nurse or doctor.        fish oil-omega-3 fatty acids 1000 MG capsule Take 1 g by mouth daily.   LYSINE PO Take 1 each by mouth daily.   multivitamin,tx-minerals tablet Take 1 tablet by mouth daily.   ramipril 2.5 MG capsule Commonly known as: ALTACE Take 1 capsule (2.5 mg total) by mouth daily.   VITAMIN D-3 PO          Objective:   Physical Exam BP 136/86 (BP Location: Left Arm, Patient Position: Sitting, Cuff Size: Small)   Pulse 81   Temp 98.6 F (37 C) (Oral)   Resp 16   Ht 5\' 4"  (1.626 m)   Wt 139 lb 2 oz (63.1 kg)   SpO2 97%   BMI 23.88 kg/m  General: Well developed, NAD, BMI noted Neck: No  thyromegaly  HEENT:  Normocephalic . Face symmetric, atraumatic Lungs:  CTA B Normal respiratory effort, no intercostal retractions, no accessory muscle use. Heart: RRR,  no murmur.  Abdomen:  Not distended, soft, non-tender. No rebound or rigidity.   Lower extremities: no pretibial edema bilaterally   Skin: Exposed areas without rash. Not pale. Not jaundice Neurologic:  alert & oriented X3.  Speech normal, gait appropriate for age and unassisted Strength symmetric and appropriate for age.  Psych: Cognition and judgment appear intact.  Cooperative with normal attention span and concentration.  Behavior appropriate. No anxious or depressed appearing.     Assessment      Assessment  HTN MVP BCC skin cancer-- derm q 6 months C2 cervical Fx 1986, no surgery, had a halo Normal stress test 2010 Osteopenia, T score -1.7 @ gyn  (01-2017)  PLAN: Here for physical exam HTN: On Altace, ambulatory BPs 120/80.  Checking labs BCC: Sees Derm regularly Osteopenia: Follow-up by gyn, on vitamin D, she is active. Stress: High stress at work, managing very well with meditation, breathing exercises. RTC 1 year  this visit occurred during the SARS-CoV-2 public health emergency.  Safety protocols were in place, including screening questions prior to the visit, additional usage of staff PPE, and extensive cleaning of exam room while observing appropriate contact time as indicated for disinfecting solutions.

## 2019-12-30 ENCOUNTER — Encounter: Payer: Self-pay | Admitting: Internal Medicine

## 2019-12-30 NOTE — Assessment & Plan Note (Signed)
Here for physical exam HTN: On Altace, ambulatory BPs 120/80.  Checking labs BCC: Sees Derm regularly Osteopenia: Follow-up by gyn, on vitamin D, she is active. Stress: High stress at work, managing very well with meditation, breathing exercises. RTC 1 year

## 2019-12-30 NOTE — Assessment & Plan Note (Signed)
-  Td  2019 -  Request Shingrix, side effects discussed, #1 provided today, RTC 2 months - covid vax x 3 -  Had a flu shot -Female care   Menopausal ~ 2015, sees Dr Julien Girt, had a MMG    -CCS: cscope (-) except for hemorrhoids 09-2011, next per Dr Sharlett Iles -Diet and exercise: Doing well -labs: CMP, FLP, CBC, TSH

## 2020-01-16 DIAGNOSIS — L570 Actinic keratosis: Secondary | ICD-10-CM | POA: Diagnosis not present

## 2020-01-16 DIAGNOSIS — L814 Other melanin hyperpigmentation: Secondary | ICD-10-CM | POA: Diagnosis not present

## 2020-01-16 DIAGNOSIS — D225 Melanocytic nevi of trunk: Secondary | ICD-10-CM | POA: Diagnosis not present

## 2020-01-16 DIAGNOSIS — Z85828 Personal history of other malignant neoplasm of skin: Secondary | ICD-10-CM | POA: Diagnosis not present

## 2020-01-16 DIAGNOSIS — L821 Other seborrheic keratosis: Secondary | ICD-10-CM | POA: Diagnosis not present

## 2020-02-05 ENCOUNTER — Other Ambulatory Visit: Payer: Self-pay | Admitting: Internal Medicine

## 2020-03-01 ENCOUNTER — Ambulatory Visit (INDEPENDENT_AMBULATORY_CARE_PROVIDER_SITE_OTHER): Payer: BC Managed Care – PPO

## 2020-03-01 ENCOUNTER — Other Ambulatory Visit: Payer: Self-pay

## 2020-03-01 DIAGNOSIS — Z23 Encounter for immunization: Secondary | ICD-10-CM | POA: Diagnosis not present

## 2020-03-01 NOTE — Progress Notes (Signed)
Patient here today for shingrix vaccine. 0.79mL given in left deltoid IM. Patient tolerated well.

## 2020-04-11 ENCOUNTER — Encounter: Payer: Self-pay | Admitting: Internal Medicine

## 2020-05-20 DIAGNOSIS — Z6824 Body mass index (BMI) 24.0-24.9, adult: Secondary | ICD-10-CM | POA: Diagnosis not present

## 2020-05-20 DIAGNOSIS — Z01419 Encounter for gynecological examination (general) (routine) without abnormal findings: Secondary | ICD-10-CM | POA: Diagnosis not present

## 2020-05-21 DIAGNOSIS — Z01419 Encounter for gynecological examination (general) (routine) without abnormal findings: Secondary | ICD-10-CM | POA: Diagnosis not present

## 2020-05-22 LAB — HM PAP SMEAR: HPV, high-risk: NEGATIVE

## 2020-08-12 ENCOUNTER — Other Ambulatory Visit: Payer: Self-pay | Admitting: Internal Medicine

## 2020-08-12 DIAGNOSIS — Z1231 Encounter for screening mammogram for malignant neoplasm of breast: Secondary | ICD-10-CM

## 2020-09-12 DIAGNOSIS — Z85828 Personal history of other malignant neoplasm of skin: Secondary | ICD-10-CM | POA: Diagnosis not present

## 2020-09-12 DIAGNOSIS — L570 Actinic keratosis: Secondary | ICD-10-CM | POA: Diagnosis not present

## 2020-09-12 DIAGNOSIS — L812 Freckles: Secondary | ICD-10-CM | POA: Diagnosis not present

## 2020-09-12 DIAGNOSIS — L820 Inflamed seborrheic keratosis: Secondary | ICD-10-CM | POA: Diagnosis not present

## 2020-09-12 DIAGNOSIS — L918 Other hypertrophic disorders of the skin: Secondary | ICD-10-CM | POA: Diagnosis not present

## 2020-09-12 DIAGNOSIS — L821 Other seborrheic keratosis: Secondary | ICD-10-CM | POA: Diagnosis not present

## 2020-10-14 DIAGNOSIS — H5319 Other subjective visual disturbances: Secondary | ICD-10-CM | POA: Diagnosis not present

## 2020-10-17 ENCOUNTER — Ambulatory Visit
Admission: RE | Admit: 2020-10-17 | Discharge: 2020-10-17 | Disposition: A | Discharge status: Home / Self Care | Payer: BC Managed Care – PPO | Source: Ambulatory Visit | Attending: Internal Medicine | Admitting: Internal Medicine

## 2020-10-17 ENCOUNTER — Other Ambulatory Visit: Payer: Self-pay

## 2020-10-17 DIAGNOSIS — Z1231 Encounter for screening mammogram for malignant neoplasm of breast: Secondary | ICD-10-CM | POA: Diagnosis not present

## 2020-12-30 ENCOUNTER — Ambulatory Visit (INDEPENDENT_AMBULATORY_CARE_PROVIDER_SITE_OTHER): Payer: BC Managed Care – PPO | Admitting: Internal Medicine

## 2020-12-30 ENCOUNTER — Encounter: Payer: Self-pay | Admitting: Internal Medicine

## 2020-12-30 VITALS — BP 132/76 | HR 70 | Temp 98.2°F | Resp 16 | Ht 64.0 in | Wt 142.0 lb

## 2020-12-30 DIAGNOSIS — I10 Essential (primary) hypertension: Secondary | ICD-10-CM

## 2020-12-30 DIAGNOSIS — Z0000 Encounter for general adult medical examination without abnormal findings: Secondary | ICD-10-CM

## 2020-12-30 LAB — COMPREHENSIVE METABOLIC PANEL
ALT: 24 U/L (ref 0–35)
AST: 22 U/L (ref 0–37)
Albumin: 4.2 g/dL (ref 3.5–5.2)
Alkaline Phosphatase: 64 U/L (ref 39–117)
BUN: 14 mg/dL (ref 6–23)
CO2: 29 mEq/L (ref 19–32)
Calcium: 9.1 mg/dL (ref 8.4–10.5)
Chloride: 105 mEq/L (ref 96–112)
Creatinine, Ser: 0.62 mg/dL (ref 0.40–1.20)
GFR: 97.37 mL/min (ref >60.00)
Glucose, Bld: 85 mg/dL (ref 70–99)
Potassium: 3.9 mEq/L (ref 3.5–5.1)
Sodium: 141 mEq/L (ref 135–145)
Total Bilirubin: 0.6 mg/dL (ref 0.2–1.2)
Total Protein: 6.4 g/dL (ref 6.0–8.3)

## 2020-12-30 LAB — LIPID PANEL
Cholesterol: 229 mg/dL — ABNORMAL HIGH (ref 0–200)
HDL: 93.5 mg/dL (ref >39.00)
LDL Cholesterol: 125 mg/dL — ABNORMAL HIGH (ref 0–99)
NonHDL: 135.77
Total CHOL/HDL Ratio: 2
Triglycerides: 52 mg/dL (ref 0.0–149.0)
VLDL: 10.4 mg/dL (ref 0.0–40.0)

## 2020-12-30 LAB — CBC WITH DIFFERENTIAL/PLATELET
Basophils Absolute: 0 10*3/uL (ref 0.0–0.1)
Basophils Relative: 0.9 % (ref 0.0–3.0)
Eosinophils Absolute: 0.1 10*3/uL (ref 0.0–0.7)
Eosinophils Relative: 2.9 % (ref 0.0–5.0)
HCT: 40.6 % (ref 36.0–46.0)
Hemoglobin: 13.6 g/dL (ref 12.0–15.0)
Lymphocytes Relative: 41.9 % (ref 12.0–46.0)
Lymphs Abs: 1.5 10*3/uL (ref 0.7–4.0)
MCHC: 33.5 g/dL (ref 30.0–36.0)
MCV: 90.8 fl (ref 78.0–100.0)
Monocytes Absolute: 0.2 10*3/uL (ref 0.1–1.0)
Monocytes Relative: 5.6 % (ref 3.0–12.0)
Neutro Abs: 1.7 10*3/uL (ref 1.4–7.7)
Neutrophils Relative %: 48.7 % (ref 43.0–77.0)
Platelets: 193 10*3/uL (ref 150.0–400.0)
RBC: 4.47 Mil/uL (ref 3.87–5.11)
RDW: 12.8 % (ref 11.5–15.5)
WBC: 3.5 10*3/uL — ABNORMAL LOW (ref 4.0–10.5)

## 2020-12-30 LAB — TSH: TSH: 1.38 u[IU]/mL (ref 0.35–5.50)

## 2020-12-30 MED ORDER — CIPROFLOXACIN HCL 500 MG PO TABS: 500.0000 mg | ORAL_TABLET | Freq: Two times a day (BID) | ORAL | 2 refills | Status: DC

## 2020-12-30 NOTE — Patient Instructions (Signed)
Check the  blood pressure regularly BP GOAL is between 110/65 and  135/85. If it is consistently higher or lower, let me know  When the time comes for your colonoscopy, please reach out to gastroenterology at [phone]  Use Cipro only for traveler's diarrhea.  If you have severe symptoms see a local doctor   GO TO THE LAB : Get the blood work     [place], [place] back for   a physical exam in 1 year

## 2020-12-30 NOTE — Assessment & Plan Note (Signed)
-  Td  2019 -  s/p Shingrix  - covid vax x 4, last was a bivalent thus UTD -  Had a flu shot -Female care   Menopausal ~ 2015, sees Dr Julien Girt,   [place].  DEXA as gynecology -CCS: cscope (-) except for hemorrhoids 09-2011, next per Dr Sharlett Iles, plans to proceed  -Diet and exercise: Doing well -labs: CMP, FLP, CBC, TSH, vitamin D

## 2020-12-30 NOTE — Progress Notes (Signed)
Subjective:    Patient ID: [name] [name], female    DOB: [birth year], 60 y.o.   MRN: [id number]  DOS:  12/30/2020 Type of visit - description: cpx Since the last office visit is doing well.  Has no major concerns.   Review of Systems   A 14 point review of systems is negative    Past Medical History:  Diagnosis Date   C2 cervical fracture ([place]) 1986   no surgery, had a halo   HTN (hypertension)    MVP (mitral valve prolapse)    Normal cardiac stress test 04/05/2008   Negative   Osteopenia    Skin cancer, basal cell    sees derm q 6 months     Past Surgical History:  Procedure Laterality Date   DILATION AND CURETTAGE OF UTERUS  2009   PALATAL OSTEOTOMY     POLYPECTOMY     Uterus   Social History   Socioeconomic History   Marital status: Divorced    Spouse name: Not on file   Number of children: 0   Years of education: Not on file   Highest education level: Not on file  Occupational History   Occupation:  Mudlogger for quality   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)  Tobacco Use   Smoking status: Never   Smokeless tobacco: Never  Substance and Sexual Activity   Alcohol use: Yes    Alcohol/week: 4.0 standard drinks    Types: 4 Glasses of wine per week    Comment: socially   Drug use: Not on file   Sexual activity: Not on file  Other Topics Concern   Not on file  Social History Narrative   Single, lives w/ b-friend   Social Determinants of Health   Financial Resource Strain: Not on file  Food Insecurity: Not on file  Transportation Needs: Not on file  Physical Activity: Not on file  Stress: Not on file  Social Connections: Not on file  Intimate Partner Violence: Not on file    Allergies as of 12/30/2020       Reactions   Latex    Penicillins    ? Reaction @ age 59   Spironolactone    REACTION: blurred vision   Sulfonamide Derivatives    ? Reaction @ age4        Medication List        Accurate as of December 30, 2020 11:02 AM. If you  have any questions, ask your nurse or doctor.          ciprofloxacin 500 MG tablet Commonly known as: Cipro Take 1 tablet (500 mg total) by mouth 2 (two) times daily. As needed for travelers diarrhea Started by: Kathlene November, MD   fish oil-omega-3 fatty acids 1000 MG capsule Take 1 g by mouth daily.   LYSINE PO Take 1 each by mouth daily.   multivitamin,tx-minerals tablet Take 1 tablet by mouth daily.   ramipril 2.5 MG capsule Commonly known as: ALTACE Take 1 capsule (2.5 mg total) by mouth daily.   VITAMIN D-3 PO           Objective:   Physical Exam BP 132/76 (BP Location: Left Arm, Patient Position: Sitting, Cuff Size: Small)   Pulse 70   Temp 98.2 F (36.8 C) (Oral)   Resp 16   Ht 5\' 4"  (1.626 m)   Wt 142 lb (64.4 kg)   SpO2 98%   BMI 24.37 kg/m  General: Well developed, NAD, BMI noted Neck:  No  thyromegaly  HEENT:  Normocephalic . Face symmetric, atraumatic Lungs:  CTA B Normal respiratory effort, no intercostal retractions, no accessory muscle use. Heart: RRR,  no murmur.  Abdomen:  Not distended, soft, non-tender. No rebound or rigidity.   Lower extremities: no pretibial edema bilaterally  Skin: Exposed areas without rash. Not pale. Not jaundice Neurologic:  alert & oriented X3.  Speech normal, gait appropriate for age and unassisted Strength symmetric and appropriate for age.  Psych: Cognition and judgment appear intact.  Cooperative with normal attention span and concentration.  Behavior appropriate. No anxious or depressed appearing.     Assessment     Assessment  HTN MVP BCC skin cancer-- derm q 6 months C2 cervical Fx 1986, no surgery, had a halo Normal stress test 2010 Osteopenia, T score -1.7 @ gyn  (01-2017)  PLAN: Here for physical exam HTN: BP today is very good, labs RF Altace as needed Osteopenia: Follow-up by gynecology, checking vitamin D. Travelers diarrhea: Request prescription for Cipro, provided, if she has severe  symptoms needs to see local MD. RTC 1 year  This visit occurred during the SARS-CoV-2 public health emergency.  Safety protocols were in place, including screening questions prior to the visit, additional usage of staff PPE, and extensive cleaning of exam room while observing appropriate contact time as indicated for disinfecting solutions.

## 2020-12-30 NOTE — Assessment & Plan Note (Signed)
Here for physical exam HTN: BP today is very good, labs RF Altace as needed Osteopenia: Follow-up by gynecology, checking vitamin D. Travelers diarrhea: Request prescription for Cipro, provided, if she has severe symptoms needs to see local MD. RTC 1 year

## 2021-01-21 ENCOUNTER — Other Ambulatory Visit: Payer: Self-pay | Admitting: Internal Medicine

## 2021-05-22 DIAGNOSIS — Z6824 Body mass index (BMI) 24.0-24.9, adult: Secondary | ICD-10-CM | POA: Diagnosis not present

## 2021-05-22 DIAGNOSIS — Z01419 Encounter for gynecological examination (general) (routine) without abnormal findings: Secondary | ICD-10-CM | POA: Diagnosis not present

## 2021-05-22 DIAGNOSIS — Z13820 Encounter for screening for osteoporosis: Secondary | ICD-10-CM | POA: Diagnosis not present

## 2021-06-13 DIAGNOSIS — M8580 Other specified disorders of bone density and structure, unspecified site: Secondary | ICD-10-CM | POA: Diagnosis not present

## 2021-08-11 ENCOUNTER — Encounter: Payer: Self-pay | Admitting: Internal Medicine

## 2021-08-18 DIAGNOSIS — L821 Other seborrheic keratosis: Secondary | ICD-10-CM | POA: Diagnosis not present

## 2021-08-18 DIAGNOSIS — D2272 Melanocytic nevi of left lower limb, including hip: Secondary | ICD-10-CM | POA: Diagnosis not present

## 2021-08-18 DIAGNOSIS — L812 Freckles: Secondary | ICD-10-CM | POA: Diagnosis not present

## 2021-08-18 DIAGNOSIS — L570 Actinic keratosis: Secondary | ICD-10-CM | POA: Diagnosis not present

## 2021-08-18 DIAGNOSIS — Z85828 Personal history of other malignant neoplasm of skin: Secondary | ICD-10-CM | POA: Diagnosis not present

## 2021-09-19 ENCOUNTER — Ambulatory Visit (AMBULATORY_SURGERY_CENTER): Payer: Self-pay | Admitting: *Deleted

## 2021-09-19 VITALS — Ht 64.0 in | Wt 141.6 lb

## 2021-09-19 DIAGNOSIS — Z1211 Encounter for screening for malignant neoplasm of colon: Secondary | ICD-10-CM

## 2021-09-19 MED ORDER — NA SULFATE-K SULFATE-MG SULF 17.5-3.13-1.6 GM/177ML PO SOLN: 1.0000 | Freq: Once | ORAL | 0 refills | Status: AC

## 2021-09-19 NOTE — Progress Notes (Signed)
No egg or soy allergy known to patient  No issues known to pt with past sedation with any surgeries or procedures Patient denies ever being told they had issues or difficulty with intubation  No FH of Malignant Hyperthermia Pt is not on diet pills Pt is not on  home 02  Pt is not on blood thinners  Pt denies issues with constipation  No A fib or A flutter Have any cardiac testing pending--no Pt instructed to use Singlecare.com or GoodRx for a price reduction on prep   

## 2021-09-23 ENCOUNTER — Encounter: Payer: Self-pay | Admitting: Internal Medicine

## 2021-10-10 ENCOUNTER — Encounter: Payer: Self-pay | Admitting: Internal Medicine

## 2021-10-10 ENCOUNTER — Ambulatory Visit (AMBULATORY_SURGERY_CENTER): Payer: BC Managed Care – PPO | Admitting: Internal Medicine

## 2021-10-10 VITALS — BP 123/80 | HR 71 | Temp 97.7°F | Resp 14 | Ht 64.0 in | Wt 141.6 lb

## 2021-10-10 DIAGNOSIS — Z1211 Encounter for screening for malignant neoplasm of colon: Secondary | ICD-10-CM

## 2021-10-10 MED ORDER — SODIUM CHLORIDE 0.9 % IV SOLN: 500.0000 mL | Freq: Once | INTRAVENOUS | Status: DC

## 2021-10-10 NOTE — Patient Instructions (Signed)
Discharge instructions given. Handout on Hemorrhoids. Resume previous medications. YOU HAD AN ENDOSCOPIC PROCEDURE TODAY AT THE [facility]:   Refer to the procedure report that was given to you for any specific questions about what was found during the examination.  If the procedure report does not answer your questions, please call your gastroenterologist to clarify.  If you requested that your care partner not be given the details of your procedure findings, then the procedure report has been included in a sealed envelope for you to review at your convenience later.  YOU SHOULD EXPECT: Some feelings of bloating in the abdomen. Passage of more gas than usual.  Walking can help get rid of the air that was put into your GI tract during the procedure and reduce the bloating. If you had a lower endoscopy (such as a colonoscopy or flexible sigmoidoscopy) you may notice spotting of blood in your stool or on the toilet paper. If you underwent a bowel prep for your procedure, you may not have a normal bowel movement for a few days.  Please Note:  You might notice some irritation and congestion in your nose or some drainage.  This is from the oxygen used during your procedure.  There is no need for concern and it should clear up in a day or so.  SYMPTOMS TO REPORT IMMEDIATELY:  Following lower endoscopy (colonoscopy or flexible sigmoidoscopy):  Excessive amounts of blood in the stool  Significant tenderness or worsening of abdominal pains  Swelling of the abdomen that is new, acute  Fever of 100F or higher   For urgent or emergent issues, a gastroenterologist can be reached at any hour by calling [phone]. Do not use MyChart messaging for urgent concerns.    DIET:  We do recommend a small meal at first, but then you may proceed to your regular diet.  Drink plenty of fluids but you should avoid alcoholic beverages for 24 hours.  ACTIVITY:  You should plan to take it easy for the  rest of today and you should NOT DRIVE or use heavy machinery until tomorrow (because of the sedation medicines used during the test).    FOLLOW UP: Our staff will call the number listed on your records the next business day following your procedure.  We will call around 7:15- 8:00 am to check on you and address any questions or concerns that you may have regarding the information given to you following your procedure. If we do not reach you, we will leave a message.     If any biopsies were taken you will be contacted by phone or by letter within the next 1-3 weeks.  Please call us at [phone] if you have not heard about the biopsies in 3 weeks.    SIGNATURES/CONFIDENTIALITY: You and/or your care partner have signed paperwork which will be entered into your electronic medical record.  These signatures attest to the fact that that the information above on your After Visit Summary has been reviewed and is understood.  Full responsibility of the confidentiality of this discharge information lies with you and/or your care-partner. 

## 2021-10-10 NOTE — Op Note (Signed)
[facility] Patient Name: [name] [name] Procedure Date: 10/10/2021 10:57 AM MRN: [id number] Endoscopist: Sonny Masters "[name] [name] ,  Age: 61 Referring MD:  Date of Birth: Aug 27, 1960 Gender: Female Account #: [id number] Procedure:                Colonoscopy Indications:              Screening for colorectal malignant neoplasm Medicines:                Monitored Anesthesia Care Procedure:                Pre-Anesthesia Assessment:                           - Prior to the procedure, a History and Physical                            was performed, and patient medications and                            allergies were reviewed. The patient's tolerance of                            previous anesthesia was also reviewed. The risks                            and benefits of the procedure and the sedation                            options and risks were discussed with the patient.                            All questions were answered, and informed consent                            was obtained. Prior Anticoagulants: The patient has                            taken no previous anticoagulant or antiplatelet                            agents. ASA Grade Assessment: II - A patient with                            mild systemic disease. After reviewing the risks                            and benefits, the patient was deemed in                            satisfactory condition to undergo the procedure.                           After obtaining informed consent, the colonoscope  was passed under direct vision. Throughout the                            procedure, the patient's blood pressure, pulse, and                            oxygen saturations were monitored continuously. The                            Olympus PCF-H190DL (#[id number]) Colonoscope was                            introduced through the anus and advanced to the the                            terminal  ileum. The colonoscopy was performed                            without difficulty. The patient tolerated the                            procedure well. The quality of the bowel                            preparation was good. The terminal ileum, ileocecal                            valve, appendiceal orifice, and rectum were                            photographed. Scope In: 11:16:53 AM Scope Out: 11:28:04 AM Scope Withdrawal Time: 0 hours 7 minutes 35 seconds  Total Procedure Duration: 0 hours 11 minutes 11 seconds  Findings:                 The terminal ileum appeared normal.                           Prolapsed internal hemorrhoids were found during                            retroflexion and during perianal exam. The                            hemorrhoids were large.                           The exam was otherwise without abnormality. Complications:            No immediate complications. Estimated Blood Loss:     Estimated blood loss: none. Impression:               - The examined portion of the ileum was normal.                           - Prolapsed internal hemorrhoids.                           -  The examination was otherwise normal.                           - No specimens collected. Recommendation:           - Discharge patient to home (with escort).                           - Repeat colonoscopy in 10 years for screening                            purposes.                           - The findings and recommendations were discussed                            with the patient. Dr Georgian Co "[place]" [place],  10/10/2021 11:31:24 AM

## 2021-10-10 NOTE — Progress Notes (Signed)
PT taken to PACU. Monitors in place. VSS. Report given to RN. 

## 2021-10-10 NOTE — Progress Notes (Signed)
VS completed by DT.  Pt's states no medical or surgical changes since previsit or office visit.  

## 2021-10-10 NOTE — Progress Notes (Signed)
GASTROENTEROLOGY PROCEDURE H&P NOTE   Primary Care Physician: Colon Branch, MD    Reason for Procedure:   Colon cancer screening  Plan:    Colonoscopy  Patient is appropriate for endoscopic procedure(s) in the ambulatory ([facility]) setting.  The nature of the procedure, as well as the risks, benefits, and alternatives were carefully and thoroughly reviewed with the patient. Ample time for discussion and questions allowed. The patient understood, was satisfied, and agreed to proceed.     HPI: [name] [name] is a 61 y.o. female who presents for colonoscopy for colon cancer screening. Denies blood in stools, changes in bowel habits, weight loss. Denies family history of colon cancer.  Past Medical History:  Diagnosis Date   Arthritis    hands   C2 cervical fracture ([place]) 1986   no surgery, had a halo   HTN (hypertension)    MVP (mitral valve prolapse)    Normal cardiac stress test 04/05/2008   Negative   Osteopenia    Skin cancer, basal cell    sees derm q 6 months     Past Surgical History:  Procedure Laterality Date   COLONOSCOPY     DILATION AND CURETTAGE OF UTERUS  01/20/2007   PALATAL OSTEOTOMY     POLYPECTOMY     Uterus    Prior to Admission medications   Medication Sig Start Date End Date Taking? Authorizing Provider  Cholecalciferol (VITAMIN D-3 PO) daily. 02/19/17  Yes [provider]  fish oil-omega-3 fatty acids 1000 MG capsule Take 1 g by mouth daily.   Yes [provider]  LYSINE PO Take 1 each by mouth daily.   Yes [provider]  Multiple Vitamins-Minerals (MULTIVITAMIN,TX-MINERALS) tablet Take 1 tablet by mouth daily.   Yes [provider]  ramipril (ALTACE) 2.5 MG capsule TAKE 1 CAPSULE BY MOUTH EVERY DAY 01/21/21  Yes Paz, Alda Berthold, MD  ciprofloxacin (CIPRO) 500 MG tablet Take 1 tablet (500 mg total) by mouth 2 (two) times daily. As needed for travelers diarrhea Patient not taking: Reported on 09/19/2021 12/30/20   Colon Branch, MD    Current Outpatient Medications  Medication Sig Dispense Refill   Cholecalciferol (VITAMIN D-3 PO) daily.     fish oil-omega-3 fatty acids 1000 MG capsule Take 1 g by mouth daily.     LYSINE PO Take 1 each by mouth daily.     Multiple Vitamins-Minerals (MULTIVITAMIN,TX-MINERALS) tablet Take 1 tablet by mouth daily.     ramipril (ALTACE) 2.5 MG capsule TAKE 1 CAPSULE BY MOUTH EVERY DAY 90 capsule 3   ciprofloxacin (CIPRO) 500 MG tablet Take 1 tablet (500 mg total) by mouth 2 (two) times daily. As needed for travelers diarrhea (Patient not taking: Reported on 09/19/2021) 6 tablet 2   Current Facility-Administered Medications  Medication Dose Route Frequency Provider Last Rate Last Admin   0.9 %  sodium chloride infusion  500 mL Intravenous Once Sharyn Creamer, MD        Allergies as of 10/10/2021 - Review Complete 10/10/2021  Allergen Reaction Noted   Latex  12/30/2020   Penicillins  02/14/2007   Spironolactone  02/14/2007   Sulfonamide derivatives  02/14/2007    Family History  Problem Relation Age of Onset   Hypertension Mother    COPD Father    Cervical cancer Sister        [2] sisters   Seizures Brother    Breast cancer Maternal Grandmother 55   Coronary artery  disease Neg Hx    Diabetes Neg Hx    Colon cancer Neg Hx    Colon polyps Neg Hx    Crohn's disease Neg Hx    Esophageal cancer Neg Hx    Rectal cancer Neg Hx    Stomach cancer Neg Hx    Ulcerative colitis Neg Hx     Social History   Socioeconomic History   Marital status: Divorced    Spouse name: Not on file   Number of children: 0   Years of education: Not on file   Highest education level: Not on file  Occupational History   Occupation:  Mudlogger for quality   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)  Tobacco Use   Smoking status: Never    Passive exposure: Past ("growing up parents smoked)   Smokeless tobacco: Never  Vaping Use   Vaping Use: Never used  Substance and Sexual Activity    Alcohol use: Yes    Alcohol/week: 4.0 standard drinks of alcohol    Types: 4 Glasses of wine per week    Comment: socially   Drug use: Never   Sexual activity: Not on file  Other Topics Concern   Not on file  Social History Narrative   Single, lives w/ b-friend   Social Determinants of Health   Financial Resource Strain: Not on file  Food Insecurity: Not on file  Transportation Needs: Not on file  Physical Activity: Not on file  Stress: Not on file  Social Connections: Not on file  Intimate Partner Violence: Not on file    Physical Exam: Vital signs in last 24 hours: BP 137/80   Pulse 74   Temp 97.7 F (36.5 C) (Temporal)   Ht '5\' 4"'$  (1.626 m)   Wt 141 lb 9.6 oz (64.2 kg)   SpO2 99%   BMI 24.31 kg/m  GEN: NAD EYE: Sclerae anicteric ENT: MMM CV: Non-tachycardic Pulm: No increased work of breathing GI: Soft, NT/ND NEURO:  Alert & Oriented   Christia Reading, MD [facility]  10/10/2021 10:53 AM

## 2021-10-13 ENCOUNTER — Telehealth: Payer: Self-pay

## 2021-10-13 NOTE — Telephone Encounter (Signed)
  Follow up Call-     10/10/2021   10:19 AM  Call back number  Post procedure Call Back phone  # [phone]  Permission to leave phone message Yes     Left message

## 2021-10-27 ENCOUNTER — Other Ambulatory Visit: Payer: Self-pay | Admitting: Internal Medicine

## 2021-10-27 DIAGNOSIS — Z1231 Encounter for screening mammogram for malignant neoplasm of breast: Secondary | ICD-10-CM

## 2021-11-26 ENCOUNTER — Ambulatory Visit
Admission: RE | Admit: 2021-11-26 | Discharge: 2021-11-26 | Disposition: A | Discharge status: Home / Self Care | Payer: BC Managed Care – PPO | Source: Ambulatory Visit | Attending: Internal Medicine | Admitting: Internal Medicine

## 2021-11-26 DIAGNOSIS — Z1231 Encounter for screening mammogram for malignant neoplasm of breast: Secondary | ICD-10-CM

## 2022-01-07 ENCOUNTER — Encounter: Payer: Self-pay | Admitting: Internal Medicine

## 2022-01-08 ENCOUNTER — Encounter: Payer: Self-pay | Admitting: Internal Medicine

## 2022-01-08 ENCOUNTER — Ambulatory Visit (INDEPENDENT_AMBULATORY_CARE_PROVIDER_SITE_OTHER): Payer: BC Managed Care – PPO | Admitting: Internal Medicine

## 2022-01-08 VITALS — BP 138/88 | HR 71 | Temp 98.1°F | Resp 16 | Ht 64.0 in | Wt 139.2 lb

## 2022-01-08 DIAGNOSIS — Z0000 Encounter for general adult medical examination without abnormal findings: Secondary | ICD-10-CM

## 2022-01-08 DIAGNOSIS — Z09 Encounter for follow-up examination after completed treatment for conditions other than malignant neoplasm: Secondary | ICD-10-CM | POA: Diagnosis not present

## 2022-01-08 DIAGNOSIS — I10 Essential (primary) hypertension: Secondary | ICD-10-CM | POA: Diagnosis not present

## 2022-01-08 LAB — COMPREHENSIVE METABOLIC PANEL
ALT: 27 U/L (ref 0–35)
AST: 26 U/L (ref 0–37)
Albumin: 4.4 g/dL (ref 3.5–5.2)
Alkaline Phosphatase: 71 U/L (ref 39–117)
BUN: 14 mg/dL (ref 6–23)
CO2: 30 mEq/L (ref 19–32)
Calcium: 9.8 mg/dL (ref 8.4–10.5)
Chloride: 103 mEq/L (ref 96–112)
Creatinine, Ser: 0.62 mg/dL (ref 0.40–1.20)
GFR: 96.68 mL/min (ref >60.00)
Glucose, Bld: 89 mg/dL (ref 70–99)
Potassium: 4.8 mEq/L (ref 3.5–5.1)
Sodium: 140 mEq/L (ref 135–145)
Total Bilirubin: 0.6 mg/dL (ref 0.2–1.2)
Total Protein: 6.9 g/dL (ref 6.0–8.3)

## 2022-01-08 LAB — CBC WITH DIFFERENTIAL/PLATELET
Basophils Absolute: 0 10*3/uL (ref 0.0–0.1)
Basophils Relative: 0.4 % (ref 0.0–3.0)
Eosinophils Absolute: 0.1 10*3/uL (ref 0.0–0.7)
Eosinophils Relative: 2.6 % (ref 0.0–5.0)
HCT: 39.6 % (ref 36.0–46.0)
Hemoglobin: 13.5 g/dL (ref 12.0–15.0)
Lymphocytes Relative: 48.5 % — ABNORMAL HIGH (ref 12.0–46.0)
Lymphs Abs: 1.7 10*3/uL (ref 0.7–4.0)
MCHC: 34 g/dL (ref 30.0–36.0)
MCV: 87.4 fl (ref 78.0–100.0)
Monocytes Absolute: 0.3 10*3/uL (ref 0.1–1.0)
Monocytes Relative: 8.2 % (ref 3.0–12.0)
Neutro Abs: 1.4 10*3/uL (ref 1.4–7.7)
Neutrophils Relative %: 40.3 % — ABNORMAL LOW (ref 43.0–77.0)
Platelets: 264 10*3/uL (ref 150.0–400.0)
RBC: 4.53 Mil/uL (ref 3.87–5.11)
RDW: 13.3 % (ref 11.5–15.5)
WBC: 3.4 10*3/uL — ABNORMAL LOW (ref 4.0–10.5)

## 2022-01-08 LAB — LIPID PANEL
Cholesterol: 206 mg/dL — ABNORMAL HIGH (ref 0–200)
HDL: 65.5 mg/dL (ref >39.00)
LDL Cholesterol: 128 mg/dL — ABNORMAL HIGH (ref 0–99)
NonHDL: 140.46
Total CHOL/HDL Ratio: 3
Triglycerides: 63 mg/dL (ref 0.0–149.0)
VLDL: 12.6 mg/dL (ref 0.0–40.0)

## 2022-01-08 NOTE — Assessment & Plan Note (Signed)
-   Td  2019 - RSV d/w pt  -  s/p Shingrix  - covid vax  UTD -  Had a flu shot -Female care   Menopausal ~ 2015, sees Dr Julien Girt,   [place]   [phone].  DEXA as gynecology -CCS: cscope (-) except for hemorrhoids 09-2011, c scope 2023 next 10 years per pt  -Diet and exercise: Doing well -labs: CMP, FLP, CBC

## 2022-01-08 NOTE — Patient Instructions (Addendum)
Check the  blood pressure regularly BP GOAL is between 110/65 and  135/85. If it is consistently higher or lower, let me know     GO TO THE LAB : Get the blood work     [place], [place] Come back for physical exam in 1 year

## 2022-01-08 NOTE — Progress Notes (Signed)
Subjective:    Patient ID: [name] [name], female    DOB: Jan 11, 1961, 61 y.o.   MRN: [id number]  DOS:  01/08/2022 Type of visit - description: cpx  Since the last office visit is doing well. Did get COVID earlier this month, she feels fully recuperated.  Had some fatigue and brain fog.  Review of Systems  Other than above, a 14 point review of systems is negative      Past Medical History:  Diagnosis Date   Arthritis    hands   C2 cervical fracture ([place]) 1986   no surgery, had a halo   HTN (hypertension)    MVP (mitral valve prolapse)    Normal cardiac stress test 04/05/2008   Negative   Osteopenia    Skin cancer, basal cell    sees derm q 6 months     Past Surgical History:  Procedure Laterality Date   COLONOSCOPY     DILATION AND CURETTAGE OF UTERUS  01/20/2007   PALATAL OSTEOTOMY     POLYPECTOMY     Uterus   Social History   Socioeconomic History   Marital status: Divorced    Spouse name: Not on file   Number of children: 0   Years of education: Not on file   Highest education level: Not on file  Occupational History   Occupation:  Mudlogger for quality   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)  Tobacco Use   Smoking status: Never    Passive exposure: Past ("growing up parents smoked)   Smokeless tobacco: Never  Vaping Use   Vaping Use: Never used  Substance and Sexual Activity   Alcohol use: Yes    Alcohol/week: 4.0 standard drinks of alcohol    Types: 4 Glasses of wine per week    Comment: socially   Drug use: Never   Sexual activity: Not on file  Other Topics Concern   Not on file  Social History Narrative   Single, lives w/ b-friend   Social Determinants of Health   Financial Resource Strain: Not on file  Food Insecurity: Not on file  Transportation Needs: Not on file  Physical Activity: Not on file  Stress: Not on file  Social Connections: Not on file  Intimate Partner Violence: Not on file    Current Outpatient Medications   Medication Instructions   Cholecalciferol (VITAMIN D-3 PO) Daily   fish oil-omega-3 fatty acids 1 g, Oral, Daily,     LYSINE PO 1 each, Oral, Daily,     Multiple Vitamins-Minerals (MULTIVITAMIN,TX-MINERALS) tablet 1 tablet, Oral, Daily,     ramipril (ALTACE) 2.5 MG capsule TAKE 1 CAPSULE BY MOUTH EVERY DAY       Objective:   Physical Exam BP 138/88   Pulse 71   Temp 98.1 F (36.7 C) (Oral)   Resp 16   Ht '5\' 4"'$  (1.626 m)   Wt 139 lb 4 oz (63.2 kg)   LMP 01/19/2013   SpO2 97%   BMI 23.90 kg/m  General: Well developed, NAD, BMI noted Neck: No  thyromegaly  HEENT:  Normocephalic . Face symmetric, atraumatic Lungs:  CTA B Normal respiratory effort, no intercostal retractions, no accessory muscle use. Heart: RRR,  no murmur.  Abdomen:  Not distended, soft, non-tender. No rebound or rigidity.   Lower extremities: no pretibial edema bilaterally  Skin: Exposed areas without rash. Not pale. Not jaundice Neurologic:  alert & oriented X3.  Speech normal, gait appropriate for age and unassisted Strength symmetric and appropriate  for age.  Psych: Cognition and judgment appear intact.  Cooperative with normal attention span and concentration.  Behavior appropriate. No anxious or depressed appearing.     Assessment     Assessment  HTN MVP BCC skin cancer-- derm q 6 months C2 cervical Fx 1986, no surgery, had a halo Normal stress test 2010 Osteopenia, T score -1.7 @ gyn  (01-2017)  PLAN: Here for physical exam HTN: BP today is very good, at home is typically even better in the 120s.  Continue ramipril.  Labs. Osteopenia, on vitamin D, exercises regularly, DEXA as gynecology RTC 1 year

## 2022-01-08 NOTE — Assessment & Plan Note (Signed)
Here for physical exam HTN: BP today is very good, at home is typically even better in the 120s.  Continue ramipril.  Labs. Osteopenia, on vitamin D, exercises regularly, DEXA as gynecology RTC 1 year

## 2022-01-15 ENCOUNTER — Telehealth: Payer: Self-pay | Admitting: Internal Medicine

## 2022-01-15 MED ORDER — RAMIPRIL 2.5 MG PO CAPS: 2.5000 mg | ORAL_CAPSULE | Freq: Every day | ORAL | 3 refills | Status: DC

## 2022-01-15 NOTE — Telephone Encounter (Signed)
Rx sent 

## 2022-01-15 NOTE — Telephone Encounter (Signed)
Patient requesting a refill on: ramipril (ALTACE) 2.5 MG capsule [[id number]]    Pharm: [facility] [place], [address]

## 2022-02-19 DIAGNOSIS — L812 Freckles: Secondary | ICD-10-CM | POA: Diagnosis not present

## 2022-02-19 DIAGNOSIS — L570 Actinic keratosis: Secondary | ICD-10-CM | POA: Diagnosis not present

## 2022-02-19 DIAGNOSIS — Z85828 Personal history of other malignant neoplasm of skin: Secondary | ICD-10-CM | POA: Diagnosis not present

## 2022-02-19 DIAGNOSIS — L821 Other seborrheic keratosis: Secondary | ICD-10-CM | POA: Diagnosis not present

## 2022-02-19 DIAGNOSIS — D225 Melanocytic nevi of trunk: Secondary | ICD-10-CM | POA: Diagnosis not present

## 2022-05-13 DIAGNOSIS — H00021 Hordeolum internum right upper eyelid: Secondary | ICD-10-CM | POA: Diagnosis not present

## 2022-05-25 DIAGNOSIS — Z6825 Body mass index (BMI) 25.0-25.9, adult: Secondary | ICD-10-CM | POA: Diagnosis not present

## 2022-05-25 DIAGNOSIS — Z01419 Encounter for gynecological examination (general) (routine) without abnormal findings: Secondary | ICD-10-CM | POA: Diagnosis not present

## 2022-06-25 ENCOUNTER — Ambulatory Visit
Admission: RE | Admit: 2022-06-25 | Discharge: 2022-06-25 | Disposition: A | Discharge status: Home / Self Care | Payer: BC Managed Care – PPO | Source: Ambulatory Visit | Attending: Nurse Practitioner | Admitting: Nurse Practitioner

## 2022-06-25 ENCOUNTER — Ambulatory Visit (INDEPENDENT_AMBULATORY_CARE_PROVIDER_SITE_OTHER): Payer: BC Managed Care – PPO

## 2022-06-25 VITALS — BP 170/108 | HR 78 | Temp 98.8°F | Resp 16

## 2022-06-25 DIAGNOSIS — R109 Unspecified abdominal pain: Secondary | ICD-10-CM

## 2022-06-25 LAB — POCT URINALYSIS DIP (MANUAL ENTRY)
Bilirubin, UA: NEGATIVE
Glucose, UA: NEGATIVE mg/dL
Ketones, POC UA: NEGATIVE mg/dL
Leukocytes, UA: NEGATIVE
Nitrite, UA: NEGATIVE
Protein Ur, POC: NEGATIVE mg/dL
Spec Grav, UA: >=1.03 — AB (ref 1.010–1.025)
Urobilinogen, UA: 0.2 E.U./dL
pH, UA: 6 (ref 5.0–8.0)

## 2022-06-25 MED ORDER — NAPROXEN 375 MG PO TABS: 375.0000 mg | ORAL_TABLET | Freq: Two times a day (BID) | ORAL | 0 refills | Status: DC | PRN

## 2022-06-25 MED ORDER — CYCLOBENZAPRINE HCL 10 MG PO TABS: 10.0000 mg | ORAL_TABLET | Freq: Two times a day (BID) | ORAL | 0 refills | Status: DC | PRN

## 2022-06-25 NOTE — Discharge Instructions (Signed)
Trial of Flexeril as needed.  Please note this medication can make you drowsy.  Do not drink alcohol or drive while on this medication Naproxen twice daily as needed for pain.  Take this with food Heat to the back as needed Rest Please follow-up with your PCP in 1 to 2 days for recheck Please go to the emergency room if you develop any worsening symptoms I hope you feel better soon!

## 2022-06-25 NOTE — ED Triage Notes (Signed)
Pt presents to UC w/ c/o middle right sided back pain x5 days. Took ibuprofen but does not help. Pt denies direct injury to back. Pt states she may have strained it while reaching for something at work. Laying down helps the pain. Bending down and sitting make the pain worse.

## 2022-06-25 NOTE — ED Provider Notes (Signed)
[facility]    CSN: [id number] Arrival date & time: 06/25/22  1542      History   Chief Complaint Chief Complaint  Patient presents with   Back Pain    HPI [name] [name] is a 62 y.o. female presents for evaluation of back pain.  Patient reports 5 days of a constant waxing and waning nonradiating right mid back pain/flank pain.  Denies any known injury or inciting event.  Thinks it began after she reached for something at work.  No numbness/tingling/weakness of her upper extremities.  Pain is worse when she bends over or reaches for something.  Denies any fevers, chills, nausea/vomiting, dysuria, hematuria.  No history of kidney stones.  Denies history of chronic back pain.  She has been taking 2 ibuprofen with no improvement.  No other concerns at this time.   Back Pain   Past Medical History:  Diagnosis Date   Arthritis    hands   C2 cervical fracture (HCC) 1986   no surgery, had a halo   HTN (hypertension)    MVP (mitral valve prolapse)    Normal cardiac stress test 04/05/2008   Negative   Osteopenia    Skin cancer, basal cell    sees derm q 6 months     Patient Active Problem List   Diagnosis Date Noted   Osteopenia    Low back pain 11/13/2014   PCP NOTES >>> 11/05/2014   DJD (degenerative joint disease) 09/11/2011   Annual physical exam 09/01/2010   HEPATIC CYST 04/11/2008   HTN (hypertension) 02/14/2007   BASAL CELL CARCINOMA, HX OF 02/14/2007   MITRAL VALVE PROLAPSE, HX OF 02/14/2007    Past Surgical History:  Procedure Laterality Date   COLONOSCOPY     DILATION AND CURETTAGE OF UTERUS  01/20/2007   PALATAL OSTEOTOMY     POLYPECTOMY     Uterus    OB History   No obstetric history on file.      Home Medications    Prior to Admission medications   Medication Sig Start Date End Date Taking? Authorizing Provider  cyclobenzaprine (FLEXERIL) 10 MG tablet Take 1 tablet (10 mg total) by mouth 2 (two) times daily as needed for muscle  spasms. 06/25/22  Yes Radford Pax, NP  naproxen (NAPROSYN) 375 MG tablet Take 1 tablet (375 mg total) by mouth 2 (two) times daily as needed. 06/25/22  Yes Radford Pax, NP  Cholecalciferol (VITAMIN D-3 PO) daily. 02/19/17   [provider]  fish oil-omega-3 fatty acids 1000 MG capsule Take 1 g by mouth daily.    [provider]  LYSINE PO Take 1 each by mouth daily.    [provider]  Multiple Vitamins-Minerals (MULTIVITAMIN,TX-MINERALS) tablet Take 1 tablet by mouth daily.    [provider]  ramipril (ALTACE) 2.5 MG capsule Take 1 capsule (2.5 mg total) by mouth daily. 01/15/22   Wanda Plump, MD    Family History Family History  Problem Relation Age of Onset   Hypertension Mother    COPD Father    Cervical cancer Sister        [2] sisters   Seizures Brother    Breast cancer Maternal Grandmother 94   Coronary artery disease Neg Hx    Diabetes Neg Hx    Colon cancer Neg Hx    Colon polyps Neg Hx    Crohn's disease Neg Hx    Esophageal cancer Neg Hx    Rectal  cancer Neg Hx    Stomach cancer Neg Hx    Ulcerative colitis Neg Hx     Social History Social History   Tobacco Use   Smoking status: Never    Passive exposure: Past ("growing up parents smoked)   Smokeless tobacco: Never  Vaping Use   Vaping Use: Never used  Substance Use Topics   Alcohol use: Yes    Alcohol/week: 4.0 standard drinks of alcohol    Types: 4 Glasses of wine per week    Comment: socially   Drug use: Never     Allergies   Latex, Penicillins, Spironolactone, and Sulfonamide derivatives   Review of Systems Review of Systems  Musculoskeletal:  Positive for back pain.     Physical Exam Triage Vital Signs ED Triage Vitals  Enc Vitals Group     BP 06/25/22 1618 (!) 170/108     Pulse Rate 06/25/22 1618 78     Resp 06/25/22 1618 16     Temp 06/25/22 1618 98.8 F (37.1 C)     Temp Source 06/25/22 1618 Oral     SpO2 06/25/22 1618 96 %     Weight --       Height --      Head Circumference --      Peak Flow --      Pain Score 06/25/22 1622 5     Pain Loc --      Pain Edu? --      Excl. in GC? --    No data found.  Updated Vital Signs BP (!) 170/108 (BP Location: Left Arm)   Pulse 78   Temp 98.8 F (37.1 C) (Oral)   Resp 16   LMP 01/19/2013   SpO2 96%   Visual Acuity Right Eye Distance:   Left Eye Distance:   Bilateral Distance:    Right Eye Near:   Left Eye Near:    Bilateral Near:     Physical Exam Vitals and nursing note reviewed.  Constitutional:      General: She is not in acute distress.    Appearance: Normal appearance. She is not ill-appearing, toxic-appearing or diaphoretic.  HENT:     Head: Normocephalic and atraumatic.  Eyes:     Pupils: Pupils are equal, round, and reactive to light.  Cardiovascular:     Rate and Rhythm: Normal rate.  Pulmonary:     Effort: Pulmonary effort is normal.  Abdominal:     Tenderness: There is no right CVA tenderness or left CVA tenderness.  Musculoskeletal:       Arms:     Comments: Patient points to right flank as area of pain.  Very minimal tenderness to palpation.  No CVAT.  Strength 5 out of 5 bilateral upper extremities.  Skin:    General: Skin is warm and dry.  Neurological:     General: No focal deficit present.     Mental Status: She is oriented to person, place, and time.  Psychiatric:        Mood and Affect: Mood normal.        Behavior: Behavior normal.      UC Treatments / Results  Labs (all labs ordered are listed, but only abnormal results are displayed) Labs Reviewed  POCT URINALYSIS DIP (MANUAL ENTRY) - Abnormal; Notable for the following components:      Result Value   Spec Grav, UA >=1.030 (*)    Blood, UA trace-lysed (*)    All other components within  normal limits  URINE CULTURE    EKG   Radiology DG Abdomen 1 View  Result Date: 06/25/2022 CLINICAL DATA:  Right flank pain EXAM: ABDOMEN - 1 VIEW COMPARISON:  None Available. FINDINGS:  Gas and stool throughout the colon. No small or large bowel distention. No radiopaque stones. Degenerative changes in the spine and hips. Soft tissue contours appear intact. IMPRESSION: Normal nonobstructive bowel gas pattern. No radiopaque stones identified. Electronically Signed   By: Burman Nieves M.D.   On: 06/25/2022 18:01    Procedures Procedures (including critical care time)  Medications Ordered in UC Medications - No data to display  Initial Impression / Assessment and Plan / UC Course  I have reviewed the triage vital signs and the nursing notes.  Pertinent labs & imaging results that were available during my care of the patient were reviewed by me and considered in my medical decision making (see chart for details).     Reviewed exam and symptoms with patient.  Right flank pain.  UA with trace blood.  KUB wet read does not show kidney stone.  Discussed likely musculoskeletal cause Trial of Flexeril and naproxen Heat to the back as needed Rest PCP follow-up if symptoms do not improve ER precautions reviewed and patient verbalized understanding Final Clinical Impressions(s) / UC Diagnoses   Final diagnoses:  Right flank pain     Discharge Instructions      Trial of Flexeril as needed.  Please note this medication can make you drowsy.  Do not drink alcohol or drive while on this medication Naproxen twice daily as needed for pain.  Take this with food Heat to the back as needed Rest Please follow-up with your PCP in 1 to 2 days for recheck Please go to the emergency room if you develop any worsening symptoms I hope you feel better soon!     ED Prescriptions     Medication Sig Dispense Auth. Provider   naproxen (NAPROSYN) 375 MG tablet Take 1 tablet (375 mg total) by mouth 2 (two) times daily as needed. 10 tablet Radford Pax, NP   cyclobenzaprine (FLEXERIL) 10 MG tablet Take 1 tablet (10 mg total) by mouth 2 (two) times daily as needed for muscle spasms. 10  tablet Radford Pax, NP      PDMP not reviewed this encounter.   Radford Pax, NP 06/25/22 1806

## 2022-06-26 LAB — URINE CULTURE: Culture: NO GROWTH

## 2022-08-19 DIAGNOSIS — D224 Melanocytic nevi of scalp and neck: Secondary | ICD-10-CM | POA: Diagnosis not present

## 2022-08-19 DIAGNOSIS — L570 Actinic keratosis: Secondary | ICD-10-CM | POA: Diagnosis not present

## 2022-08-19 DIAGNOSIS — D225 Melanocytic nevi of trunk: Secondary | ICD-10-CM | POA: Diagnosis not present

## 2022-08-19 DIAGNOSIS — Z85828 Personal history of other malignant neoplasm of skin: Secondary | ICD-10-CM | POA: Diagnosis not present

## 2022-08-19 DIAGNOSIS — L821 Other seborrheic keratosis: Secondary | ICD-10-CM | POA: Diagnosis not present

## 2022-09-28 ENCOUNTER — Other Ambulatory Visit: Payer: Self-pay | Admitting: Internal Medicine

## 2022-09-28 DIAGNOSIS — Z1231 Encounter for screening mammogram for malignant neoplasm of breast: Secondary | ICD-10-CM

## 2022-12-01 ENCOUNTER — Ambulatory Visit
Admission: RE | Admit: 2022-12-01 | Discharge: 2022-12-01 | Disposition: A | Discharge status: Home / Self Care | Payer: BC Managed Care – PPO | Source: Ambulatory Visit | Attending: Internal Medicine | Admitting: Internal Medicine

## 2022-12-01 DIAGNOSIS — Z1231 Encounter for screening mammogram for malignant neoplasm of breast: Secondary | ICD-10-CM | POA: Diagnosis not present

## 2023-01-09 ENCOUNTER — Other Ambulatory Visit: Payer: Self-pay | Admitting: Internal Medicine

## 2023-01-15 ENCOUNTER — Ambulatory Visit (INDEPENDENT_AMBULATORY_CARE_PROVIDER_SITE_OTHER): Payer: BC Managed Care – PPO | Admitting: Internal Medicine

## 2023-01-15 ENCOUNTER — Encounter: Payer: Self-pay | Admitting: Internal Medicine

## 2023-01-15 VITALS — BP 134/87 | HR 72 | Temp 99.3°F | Resp 16 | Ht 63.5 in | Wt 143.0 lb

## 2023-01-15 DIAGNOSIS — Z0000 Encounter for general adult medical examination without abnormal findings: Secondary | ICD-10-CM

## 2023-01-15 DIAGNOSIS — I10 Essential (primary) hypertension: Secondary | ICD-10-CM

## 2023-01-15 DIAGNOSIS — M8588 Other specified disorders of bone density and structure, other site: Secondary | ICD-10-CM

## 2023-01-15 LAB — COMPREHENSIVE METABOLIC PANEL
ALT: 23 U/L (ref 0–35)
AST: 23 U/L (ref 0–37)
Albumin: 4.3 g/dL (ref 3.5–5.2)
Alkaline Phosphatase: 68 U/L (ref 39–117)
BUN: 17 mg/dL (ref 6–23)
CO2: 30 meq/L (ref 19–32)
Calcium: 9.2 mg/dL (ref 8.4–10.5)
Chloride: 104 meq/L (ref 96–112)
Creatinine, Ser: 0.66 mg/dL (ref 0.40–1.20)
GFR: 94.55 mL/min (ref >60.00)
Glucose, Bld: 89 mg/dL (ref 70–99)
Potassium: 4.4 meq/L (ref 3.5–5.1)
Sodium: 140 meq/L (ref 135–145)
Total Bilirubin: 0.8 mg/dL (ref 0.2–1.2)
Total Protein: 6.5 g/dL (ref 6.0–8.3)

## 2023-01-15 LAB — LIPID PANEL
Cholesterol: 235 mg/dL — ABNORMAL HIGH (ref 0–200)
HDL: 87.7 mg/dL (ref >39.00)
LDL Cholesterol: 130 mg/dL — ABNORMAL HIGH (ref 0–99)
NonHDL: 147.52
Total CHOL/HDL Ratio: 3
Triglycerides: 88 mg/dL (ref 0.0–149.0)
VLDL: 17.6 mg/dL (ref 0.0–40.0)

## 2023-01-15 LAB — CBC WITH DIFFERENTIAL/PLATELET
Basophils Absolute: 0 10*3/uL (ref 0.0–0.1)
Basophils Relative: 0.7 % (ref 0.0–3.0)
Eosinophils Absolute: 0.1 10*3/uL (ref 0.0–0.7)
Eosinophils Relative: 3.6 % (ref 0.0–5.0)
HCT: 40.8 % (ref 36.0–46.0)
Hemoglobin: 13.8 g/dL (ref 12.0–15.0)
Lymphocytes Relative: 42.5 % (ref 12.0–46.0)
Lymphs Abs: 1.6 10*3/uL (ref 0.7–4.0)
MCHC: 33.8 g/dL (ref 30.0–36.0)
MCV: 89.4 fL (ref 78.0–100.0)
Monocytes Absolute: 0.2 10*3/uL (ref 0.1–1.0)
Monocytes Relative: 6.5 % (ref 3.0–12.0)
Neutro Abs: 1.8 10*3/uL (ref 1.4–7.7)
Neutrophils Relative %: 46.7 % (ref 43.0–77.0)
Platelets: 220 10*3/uL (ref 150.0–400.0)
RBC: 4.57 Mil/uL (ref 3.87–5.11)
RDW: 13.8 % (ref 11.5–15.5)
WBC: 3.8 10*3/uL — ABNORMAL LOW (ref 4.0–10.5)

## 2023-01-15 LAB — VITAMIN D 25 HYDROXY (VIT D DEFICIENCY, FRACTURES): VITD: 38.36 ng/mL (ref 30.00–100.00)

## 2023-01-15 NOTE — Patient Instructions (Signed)
  Check the  blood pressure regularly Blood pressure goal:  between 110/65 and  135/85. If it is consistently higher or lower, let me know     GO TO THE LAB : Get the blood work     Next visit with me in 1 year for a physical exam    Please schedule it at the front desk        "Health Care Power of attorney" ,  "Living will" (Advance care planning documents)  If you already have a living will or healthcare power of attorney, is recommended you bring the copy to be scanned in your chart.   The document will be available to all the doctors you see in the system.  Advance care planning is a process that supports adults in  understanding and sharing their preferences regarding future medical care.  The patient's preferences are recorded in documents called Advance Directives and the can be modified at any time while the patient is in full mental capacity.   If you don't have one, please consider create one.      More information at: [email or web address]

## 2023-01-15 NOTE — Progress Notes (Signed)
Subjective:    Patient ID: [name] [name], female    DOB: [birth year], 61 y.o.   MRN: [id number]  DOS:  01/15/2023 Type of visit - description: Here for CPX  Doing well, no major concerns  Review of Systems  A 14 point review of systems is negative    Past Medical History:  Diagnosis Date   Arthritis    hands   C2 cervical fracture (HCC) 1986   no surgery, had a halo   HTN (hypertension)    MVP (mitral valve prolapse)    Normal cardiac stress test 04/05/2008   Negative   Osteopenia    Skin cancer, basal cell    sees derm q 6 months     Past Surgical History:  Procedure Laterality Date   COLONOSCOPY     DILATION AND CURETTAGE OF UTERUS  01/20/2007   PALATAL OSTEOTOMY     POLYPECTOMY     Uterus   Social History   Socioeconomic History   Marital status: Divorced    Spouse name: Not on file   Number of children: 0   Years of education: Not on file   Highest education level: Not on file  Occupational History   Occupation:  Interior and spatial designer for quality   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)  Tobacco Use   Smoking status: Never    Passive exposure: Past ("growing up parents smoked)   Smokeless tobacco: Never  Vaping Use   Vaping status: Never Used  Substance and Sexual Activity   Alcohol use: Yes    Alcohol/week: 4.0 standard drinks of alcohol    Types: 4 Glasses of wine per week    Comment: socially   Drug use: Never   Sexual activity: Not on file  Other Topics Concern   Not on file  Social History Narrative   Single, lives w/ b-friend   Social Drivers of Health   Financial Resource Strain: Not on file  Food Insecurity: Not on file  Transportation Needs: Not on file  Physical Activity: Not on file  Stress: Not on file  Social Connections: Not on file  Intimate Partner Violence: Not on file     Current Outpatient Medications  Medication Instructions   Cholecalciferol (VITAMIN D-3 PO) Daily   fish oil-omega-3 fatty acids 1 g, Daily   LYSINE PO 1 each,  Daily   Multiple Vitamins-Minerals (MULTIVITAMIN,TX-MINERALS) tablet 1 tablet, Daily   ramipril (ALTACE) 2.5 mg, Oral, Daily       Objective:   Physical Exam BP 134/87 (BP Location: Left Arm, Patient Position: Sitting, Cuff Size: Small)   Pulse 72   Temp 99.3 F (37.4 C) (Oral)   Resp 16   Ht 5' 3.5" (1.613 m)   Wt 143 lb (64.9 kg)   LMP 01/19/2013   SpO2 96%   BMI 24.93 kg/m  General: Well developed, NAD, BMI noted Neck: No  thyromegaly  HEENT:  Normocephalic . Face symmetric, atraumatic Lungs:  CTA B Normal respiratory effort, no intercostal retractions, no accessory muscle use. Heart: RRR,  no murmur.  Abdomen:  Not distended, soft, non-tender. No rebound or rigidity.   Lower extremities: no pretibial edema bilaterally  Skin: Exposed areas without rash. Not pale. Not jaundice Neurologic:  alert & oriented X3.  Speech normal, gait appropriate for age and unassisted Strength symmetric and appropriate for age.  Psych: Cognition and judgment appear intact.  Cooperative with normal attention span and concentration.  Behavior appropriate. No anxious or depressed appearing.  Assessment     Assessment  HTN MVP BCC skin cancer-- derm q 6 months C2 cervical Fx 1986, no surgery, had a halo Normal stress test 2010 Osteopenia, T score -1.7 @ gyn  (01-2017)  PLAN: Here for physical exam -Td  2019 -  s/p Shingrix  -  Had a flu shot and covid vax ~ 09/2022  -Female care   Menopausal ~ 2015, sees gyn,   MMG   ~ 11/2022 per pt   DEXA as gynecology -CCS: cscope (-) except for hemorrhoids 09-2011, cscope 2023 next 10 years per pt  -Diet and exercise: Eating healthy, room for improvement on exercise.  Encouraged to be more active -labs: CMP FLP CBC vitamin D HTN: On Altace, BP is okay today, recommend to check from time to time [facility] Sees dermatology regularly RTC 1 year

## 2023-01-15 NOTE — Assessment & Plan Note (Signed)
Here for physical exam -Td  2019 -  s/p Shingrix  -  Had a flu shot and covid vax ~ 09/2022  -Female care   Menopausal ~ 2015, sees gyn,   MMG   ~ 11/2022 per pt   DEXA as gynecology -CCS: cscope (-) except for hemorrhoids 09-2011, cscope 2023 next 10 years per pt  -Diet and exercise: Eating healthy, room for improvement on exercise.  Encouraged to be more active -labs: CMP FLP CBC vitamin D

## 2023-01-15 NOTE — Assessment & Plan Note (Signed)
Here for physical exam  HTN: On Altace, BP is okay today, recommend to check from time to time [facility] Sees dermatology regularly RTC 1 year

## 2023-04-08 ENCOUNTER — Telehealth: Payer: Self-pay | Admitting: *Deleted

## 2023-04-08 NOTE — Telephone Encounter (Signed)
 Dr. Drue Novel disregard.  Ill take care of this.

## 2023-04-08 NOTE — Telephone Encounter (Signed)
 Copied from CRM [id number]. Topic: Clinical - Medical Advice >> Apr 08, 2023 10:46 AM Orinda Kenner C wrote: Reason for CRM: Patient is asking if the office gives yellow fever vaccination?  Patient has to travel in May and her vaccine expired in October 2024. Please advise and call back [phone], it's fine to leave a message or Mychart.

## 2023-04-10 ENCOUNTER — Other Ambulatory Visit: Payer: Self-pay | Admitting: Internal Medicine

## 2023-04-19 DIAGNOSIS — L821 Other seborrheic keratosis: Secondary | ICD-10-CM | POA: Diagnosis not present

## 2023-04-19 DIAGNOSIS — D2272 Melanocytic nevi of left lower limb, including hip: Secondary | ICD-10-CM | POA: Diagnosis not present

## 2023-04-19 DIAGNOSIS — L812 Freckles: Secondary | ICD-10-CM | POA: Diagnosis not present

## 2023-04-19 DIAGNOSIS — Z85828 Personal history of other malignant neoplasm of skin: Secondary | ICD-10-CM | POA: Diagnosis not present

## 2023-07-21 DIAGNOSIS — Z1151 Encounter for screening for human papillomavirus (HPV): Secondary | ICD-10-CM | POA: Diagnosis not present

## 2023-07-21 DIAGNOSIS — Z124 Encounter for screening for malignant neoplasm of cervix: Secondary | ICD-10-CM | POA: Diagnosis not present

## 2023-07-21 DIAGNOSIS — Z6824 Body mass index (BMI) 24.0-24.9, adult: Secondary | ICD-10-CM | POA: Diagnosis not present

## 2023-07-21 DIAGNOSIS — Z01419 Encounter for gynecological examination (general) (routine) without abnormal findings: Secondary | ICD-10-CM | POA: Diagnosis not present

## 2023-07-21 DIAGNOSIS — Z13820 Encounter for screening for osteoporosis: Secondary | ICD-10-CM | POA: Diagnosis not present

## 2023-08-04 DIAGNOSIS — M8580 Other specified disorders of bone density and structure, unspecified site: Secondary | ICD-10-CM | POA: Diagnosis not present

## 2023-10-01 ENCOUNTER — Telehealth: Payer: Self-pay

## 2023-10-01 MED ORDER — COVID-19 MRNA VAC-TRIS(PFIZER) 30 MCG/0.3ML IM SUSY: 0.3000 mL | PREFILLED_SYRINGE | Freq: Once | INTRAMUSCULAR | 0 refills | Status: AC

## 2023-10-01 NOTE — Telephone Encounter (Signed)
 Spoke w/ Pt- informed I can send prescription electronically for. Rx sent to [facility].

## 2023-10-01 NOTE — Telephone Encounter (Signed)
 Copied from CRM #[id number]. Topic: Clinical - Medication Question >> Oct 01, 2023  3:12 PM Drema MATSU wrote: Reason for CRM: Patient is needing a covid prescription. She is wondering if she can get a hand written prescription. Patient is requesting a callback if she can pick it up.

## 2023-10-18 ENCOUNTER — Other Ambulatory Visit: Payer: Self-pay | Admitting: Internal Medicine

## 2023-10-18 DIAGNOSIS — Z1231 Encounter for screening mammogram for malignant neoplasm of breast: Secondary | ICD-10-CM

## 2023-10-21 DIAGNOSIS — D225 Melanocytic nevi of trunk: Secondary | ICD-10-CM | POA: Diagnosis not present

## 2023-10-21 DIAGNOSIS — Z85828 Personal history of other malignant neoplasm of skin: Secondary | ICD-10-CM | POA: Diagnosis not present

## 2023-10-21 DIAGNOSIS — L812 Freckles: Secondary | ICD-10-CM | POA: Diagnosis not present

## 2023-10-21 DIAGNOSIS — L570 Actinic keratosis: Secondary | ICD-10-CM | POA: Diagnosis not present

## 2023-10-21 DIAGNOSIS — L821 Other seborrheic keratosis: Secondary | ICD-10-CM | POA: Diagnosis not present

## 2023-12-02 ENCOUNTER — Ambulatory Visit
Admission: RE | Admit: 2023-12-02 | Discharge: 2023-12-02 | Disposition: A | Source: Ambulatory Visit | Attending: Internal Medicine | Admitting: Internal Medicine

## 2023-12-02 DIAGNOSIS — Z1231 Encounter for screening mammogram for malignant neoplasm of breast: Secondary | ICD-10-CM

## 2024-01-15 ENCOUNTER — Other Ambulatory Visit: Payer: Self-pay | Admitting: Internal Medicine

## 2024-01-18 ENCOUNTER — Encounter: Payer: Self-pay | Admitting: Internal Medicine

## 2024-01-18 ENCOUNTER — Encounter: Payer: BC Managed Care – PPO | Admitting: Internal Medicine

## 2024-01-18 VITALS — BP 126/80 | HR 73 | Temp 97.9°F | Resp 16 | Ht 63.5 in | Wt 142.1 lb

## 2024-01-18 DIAGNOSIS — I10 Essential (primary) hypertension: Secondary | ICD-10-CM | POA: Diagnosis not present

## 2024-01-18 DIAGNOSIS — Z0000 Encounter for general adult medical examination without abnormal findings: Secondary | ICD-10-CM | POA: Diagnosis not present

## 2024-01-18 LAB — LIPID PANEL
Cholesterol: 228 mg/dL — ABNORMAL HIGH (ref 28–200)
HDL: 89.5 mg/dL
LDL Cholesterol: 129 mg/dL — ABNORMAL HIGH (ref 10–99)
NonHDL: 138.58
Total CHOL/HDL Ratio: 3
Triglycerides: 48 mg/dL (ref 10.0–149.0)
VLDL: 9.6 mg/dL (ref 0.0–40.0)

## 2024-01-18 LAB — COMPREHENSIVE METABOLIC PANEL WITH GFR
ALT: 26 U/L (ref 3–35)
AST: 26 U/L (ref 5–37)
Albumin: 4.4 g/dL (ref 3.5–5.2)
Alkaline Phosphatase: 66 U/L (ref 39–117)
BUN: 18 mg/dL (ref 6–23)
CO2: 31 meq/L (ref 19–32)
Calcium: 9.3 mg/dL (ref 8.4–10.5)
Chloride: 104 meq/L (ref 96–112)
Creatinine, Ser: 0.66 mg/dL (ref 0.40–1.20)
GFR: 93.88 mL/min
Glucose, Bld: 84 mg/dL (ref 70–99)
Potassium: 4.6 meq/L (ref 3.5–5.1)
Sodium: 141 meq/L (ref 135–145)
Total Bilirubin: 0.7 mg/dL (ref 0.2–1.2)
Total Protein: 6.7 g/dL (ref 6.0–8.3)

## 2024-01-18 LAB — CBC WITH DIFFERENTIAL/PLATELET
Basophils Absolute: 0 K/uL (ref 0.0–0.1)
Basophils Relative: 1.2 % (ref 0.0–3.0)
Eosinophils Absolute: 0.1 K/uL (ref 0.0–0.7)
Eosinophils Relative: 3.9 % (ref 0.0–5.0)
HCT: 36.1 % (ref 36.0–46.0)
Hemoglobin: 12.4 g/dL (ref 12.0–15.0)
Lymphocytes Relative: 43.8 % (ref 12.0–46.0)
Lymphs Abs: 1.3 K/uL (ref 0.7–4.0)
MCHC: 34.3 g/dL (ref 30.0–36.0)
MCV: 87.8 fl (ref 78.0–100.0)
Monocytes Absolute: 0.2 K/uL (ref 0.1–1.0)
Monocytes Relative: 6.8 % (ref 3.0–12.0)
Neutro Abs: 1.3 K/uL — ABNORMAL LOW (ref 1.4–7.7)
Neutrophils Relative %: 44.3 % (ref 43.0–77.0)
Platelets: 234 K/uL (ref 150.0–400.0)
RBC: 4.11 Mil/uL (ref 3.87–5.11)
RDW: 13.3 % (ref 11.5–15.5)
WBC: 2.9 K/uL — ABNORMAL LOW (ref 4.0–10.5)

## 2024-01-18 LAB — HEMOGLOBIN A1C: Hgb A1c MFr Bld: 5.5 % (ref 4.6–6.5)

## 2024-01-18 NOTE — Assessment & Plan Note (Signed)
 Here for physical exam  Other issues HTN: On Altace  , well-controlled, labs, monitor BPs. BCC: Sees dermatology twice yearly RTC 1 year

## 2024-01-18 NOTE — Progress Notes (Signed)
 "  Subjective:    Patient ID: [name] [name], female    DOB: Jan 05, 1961, 63 y.o.   MRN: [id number]  DOS:  01/18/2024 CPX Discussed the use of AI scribe software for clinical note transcription with the patient, who gave verbal consent to proceed.  History of Present Illness [name] [name] is a 63 year old female who presents for an annual physical exam.  General health status - No new medical problems since last annual exam - No recent hospitalizations or surgeries - No fever  Cardiopulmonary symptoms - No chest pain - No shortness of breath  Gastrointestinal and genitourinary symptoms - No gastrointestinal symptoms - No urinary symptoms  Preventive health maintenance - Up to date on influenza and COVID-19 vaccinations - Mammogram, Pap smear, and colonoscopy are current - Sees dermatologist and dentist regularly  Hypertension management - Takes antihypertensive medication - Monitors blood pressure at home with good readings - Tries to stay active and eat a healthy diet  Supplement use - Takes vitamin D   Lifestyle and activity - Stays physically active - Donates blood regularly through work and the [employer]    Review of Systems  Other than above, a 14 point review of systems is negative       Past Medical History:  Diagnosis Date   Arthritis    hands   C2 cervical fracture (HCC) 1986   no surgery, had a halo   HTN (hypertension)    MVP (mitral valve prolapse)    Normal cardiac stress test 04/05/2008   Negative   Osteopenia    Skin cancer, basal cell    sees derm q 6 months     Past Surgical History:  Procedure Laterality Date   COLONOSCOPY     DILATION AND CURETTAGE OF UTERUS  01/20/2007   PALATAL OSTEOTOMY     POLYPECTOMY     Uterus    Current Outpatient Medications  Medication Instructions   Cholecalciferol (VITAMIN D -3 PO) Daily   fish oil-omega-3 fatty acids 1 g, Daily   LYSINE PO 1 each, Daily   Multiple Vitamins-Minerals  (MULTIVITAMIN,TX-MINERALS) tablet 1 tablet, Daily   ramipril  (ALTACE ) 2.5 mg, Oral, Daily       Objective:   Physical Exam BP 126/80   Pulse 73   Temp 97.9 F (36.6 C) (Oral)   Resp 16   Ht 5' 3.5 (1.613 m)   Wt 142 lb 2 oz (64.5 kg)   LMP 01/19/2013   SpO2 97%   BMI 24.78 kg/m  General: Well developed, NAD, BMI noted Neck: No  thyromegaly  HEENT:  Normocephalic . Face symmetric, atraumatic Lungs:  CTA B Normal respiratory effort, no intercostal retractions, no accessory muscle use. Heart: RRR,  no murmur.  Abdomen:  Not distended, soft, non-tender. No rebound or rigidity.   Lower extremities: no pretibial edema bilaterally  Skin: Exposed areas without rash. Not pale. Not jaundice Neurologic:  alert & oriented X3.  Speech normal, gait appropriate for age and unassisted Strength symmetric and appropriate for age.  Psych: Cognition and judgment appear intact.  Cooperative with normal attention span and concentration.  Behavior appropriate. No anxious or depressed appearing.     Assessment     Assessment  HTN MVP BCC skin cancer-- derm q 6 months C2 cervical Fx 1986, no surgery, had a halo Normal stress test 2010 Osteopenia, T score -1.7 @ gyn  (01-2017) Blood donor   PLAN Here for physical exam -Td  2019 -  s/p Shingrix   -  had a flu-covid vax  -Female care   Menopausal ~ 2015, sees gyn,   MMG   ~ 11/2023  DEXA as gynecology -CCS: cscope (-) except for hemorrhoids 09-2011, cscope 2023 next 10 years per pt  -Diet and lifestyle: She is doing very well - labs: See orders  Other issues HTN: On Altace  , well-controlled, labs, monitor BPs. BCC: Sees dermatology twice yearly RTC 1 year  "

## 2024-01-18 NOTE — Assessment & Plan Note (Signed)
 Here for physical exam -Td  2019 -  s/p Shingrix   -   had a flu-covid vax  -Female care   Menopausal ~ 2015, sees gyn,   MMG   ~ 11/2023  DEXA as gynecology -CCS: cscope (-) except for hemorrhoids 09-2011, cscope 2023 next 10 years per pt  -Diet and lifestyle: She is doing very well - labs: See orders

## 2024-01-18 NOTE — Patient Instructions (Signed)
 GO TO THE LAB :  Get the blood work    Then, go to the front desk for the checkout Please make an appointment for a physical exam in 1 year    Continue checking your blood pressure regularly Blood pressure goal:  between 110/65 and  135/85. If it is consistently higher or lower, let me know     PRIMARY HYPERTENSION: Your blood pressure is well-controlled with your current medication. -Continue taking Ramipril  2.5 mg orally daily. -Monitor your blood pressure at home regularly.  GENERAL HEALTH MAINTENANCE: Your vaccinations, mammogram, Pap smear, and colonoscopy are current. You are maintaining a healthy lifestyle. -Continue regular follow-ups with your dermatologist and dentist. -Maintain regular physical activity and a healthy diet.

## 2024-01-21 ENCOUNTER — Ambulatory Visit: Payer: Self-pay | Admitting: Internal Medicine

## 2025-01-22 ENCOUNTER — Encounter: Admitting: Internal Medicine
